# Patient Record
Sex: Male | Born: 1990
Health system: Southern US, Community
[De-identification: ages and names within clinical notes are randomized; demographics above are authoritative.]

## PROBLEM LIST (undated history)

## (undated) DIAGNOSIS — I10 Essential (primary) hypertension: Secondary | ICD-10-CM

## (undated) HISTORY — DX: Essential (primary) hypertension: I10

## (undated) HISTORY — PX: CARPAL TUNNEL RELEASE: SHX101

---
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
## 2012-07-30 ENCOUNTER — Observation Stay: Payer: Self-pay | Admitting: Surgery

## 2012-07-30 LAB — CBC WITH DIFFERENTIAL/PLATELET
Basophil #: 0.1 10*3/uL (ref 0.0–0.1)
Basophil #: 0 10*3/uL (ref 0.0–0.1)
Basophil %: 0.2 %
Eosinophil #: 0 10*3/uL (ref 0.0–0.7)
Eosinophil #: 0.1 10*3/uL (ref 0.0–0.7)
Eosinophil %: 0.1 %
Eosinophil %: 0.5 %
HCT: 46.1 % (ref 40.0–52.0)
HCT: 46.4 % (ref 40.0–52.0)
HGB: 15.4 g/dL (ref 13.0–18.0)
Lymphocyte #: 0.8 10*3/uL — ABNORMAL LOW (ref 1.0–3.6)
Lymphocyte #: 1.3 10*3/uL (ref 1.0–3.6)
Lymphocyte %: 3.9 %
MCH: 28.6 pg (ref 26.0–34.0)
MCH: 28.4 pg (ref 26.0–34.0)
MCHC: 33.5 g/dL (ref 32.0–36.0)
MCHC: 33.2 g/dL (ref 32.0–36.0)
MCV: 86 fL (ref 80–100)
Monocyte #: 1.1 x10 3/mm — ABNORMAL HIGH (ref 0.2–1.0)
Monocyte #: 1 x10 3/mm (ref 0.2–1.0)
Neutrophil #: 18 10*3/uL — ABNORMAL HIGH (ref 1.4–6.5)
Neutrophil #: 12.6 10*3/uL — ABNORMAL HIGH (ref 1.4–6.5)
Neutrophil %: 90 %
Neutrophil %: 83.5 %
RBC: 5.38 10*6/uL (ref 4.40–5.90)
RBC: 5.41 10*6/uL (ref 4.40–5.90)
RDW: 13.4 % (ref 11.5–14.5)
RDW: 13.4 % (ref 11.5–14.5)

## 2012-07-30 LAB — CBC
HCT: 45.8 % (ref 40.0–52.0)
MCH: 28.9 pg (ref 26.0–34.0)
MCHC: 33.7 g/dL (ref 32.0–36.0)
MCV: 86 fL (ref 80–100)
Platelet: 258 10*3/uL (ref 150–440)
RDW: 13.4 % (ref 11.5–14.5)

## 2012-07-30 LAB — URINALYSIS, COMPLETE
Granular Cast: 1
Hyaline Cast: 3
Ketone: NEGATIVE
Leukocyte Esterase: NEGATIVE
Nitrite: NEGATIVE
Protein: 30
Specific Gravity: 1.054 (ref 1.003–1.030)
WBC UR: 1 /HPF (ref 0–5)

## 2012-07-30 LAB — LIPASE, BLOOD: Lipase: 169 U/L (ref 73–393)

## 2012-07-31 LAB — CBC WITH DIFFERENTIAL/PLATELET
Basophil #: 0 10*3/uL (ref 0.0–0.1)
Eosinophil #: 0.1 10*3/uL (ref 0.0–0.7)
Lymphocyte %: 15.1 %
MCHC: 35 g/dL (ref 32.0–36.0)
Monocyte %: 9.7 %
Neutrophil %: 74 %
Platelet: 223 10*3/uL (ref 150–440)
RDW: 13.4 % (ref 11.5–14.5)

## 2013-04-01 ENCOUNTER — Emergency Department: Payer: Self-pay | Admitting: Emergency Medicine

## 2014-05-23 ENCOUNTER — Emergency Department: Payer: Self-pay | Admitting: Emergency Medicine
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
## 2016-05-07 ENCOUNTER — Encounter: Payer: Self-pay | Admitting: Family Medicine

## 2016-05-07 ENCOUNTER — Ambulatory Visit (INDEPENDENT_AMBULATORY_CARE_PROVIDER_SITE_OTHER): Payer: 59 | Admitting: Family Medicine

## 2016-05-07 VITALS — BP 148/84 | HR 92 | Temp 97.4°F | Ht 70.4 in | Wt 235.0 lb

## 2016-05-07 DIAGNOSIS — R109 Unspecified abdominal pain: Secondary | ICD-10-CM | POA: Diagnosis not present

## 2016-05-07 DIAGNOSIS — Z113 Encounter for screening for infections with a predominantly sexual mode of transmission: Secondary | ICD-10-CM | POA: Diagnosis not present

## 2016-05-07 DIAGNOSIS — R030 Elevated blood-pressure reading, without diagnosis of hypertension: Secondary | ICD-10-CM

## 2016-05-07 DIAGNOSIS — IMO0001 Reserved for inherently not codable concepts without codable children: Secondary | ICD-10-CM

## 2016-05-07 LAB — UA/M W/RFLX CULTURE, ROUTINE
Bilirubin, UA: NEGATIVE
Glucose, UA: NEGATIVE
Ketones, UA: NEGATIVE
LEUKOCYTES UA: NEGATIVE
NITRITE UA: NEGATIVE
PH UA: 5.5 (ref 5.0–7.5)
PROTEIN UA: NEGATIVE
RBC, UA: NEGATIVE
Specific Gravity, UA: 1.02 (ref 1.005–1.030)
Urobilinogen, Ur: 0.2 mg/dL (ref 0.2–1.0)

## 2016-05-07 LAB — MICROALBUMIN, URINE WAIVED
CREATININE, URINE WAIVED: 200 mg/dL (ref 10–300)
Microalb, Ur Waived: 30 mg/L — ABNORMAL HIGH (ref 0–19)
Microalb/Creat Ratio: <30 mg/g (ref <30)

## 2016-05-07 MED ORDER — NAPROXEN 500 MG PO TABS: 500.0000 mg | ORAL_TABLET | Freq: Two times a day (BID) | ORAL | 0 refills | Status: DC

## 2016-05-07 NOTE — Patient Instructions (Addendum)
DASH Eating Plan DASH stands for "Dietary Approaches to Stop Hypertension." The DASH eating plan is a healthy eating plan that has been shown to reduce high blood pressure (hypertension). Additional health benefits may include reducing the risk of type 2 diabetes mellitus, heart disease, and stroke. The DASH eating plan may also help with weight loss. WHAT DO I NEED TO KNOW ABOUT THE DASH EATING PLAN? For the DASH eating plan, you will follow these general guidelines:  Choose foods with a percent daily value for sodium of less than 5% (as listed on the food label).  Use salt-free seasonings or herbs instead of table salt or sea salt.  Check with your health care provider or pharmacist before using salt substitutes.  Eat lower-sodium products, often labeled as "lower sodium" or "no salt added."  Eat fresh foods.  Eat more vegetables, fruits, and low-fat dairy products.  Choose whole grains. Look for the word "whole" as the first word in the ingredient list.  Choose fish and skinless chicken or turkey more often than red meat. Limit fish, poultry, and meat to 6 oz (170 g) each day.  Limit sweets, desserts, sugars, and sugary drinks.  Choose heart-healthy fats.  Limit cheese to 1 oz (28 g) per day.  Eat more home-cooked food and less restaurant, buffet, and fast food.  Limit fried foods.  Cook foods using methods other than frying.  Limit canned vegetables. If you do use them, rinse them well to decrease the sodium.  When eating at a restaurant, ask that your food be prepared with less salt, or no salt if possible. WHAT FOODS CAN I EAT? Seek help from a dietitian for individual calorie needs. Grains Whole grain or whole wheat bread. Brown rice. Whole grain or whole wheat pasta. Quinoa, bulgur, and whole grain cereals. Low-sodium cereals. Corn or whole wheat flour tortillas. Whole grain cornbread. Whole grain crackers. Low-sodium crackers. Vegetables Fresh or frozen vegetables  (raw, steamed, roasted, or grilled). Low-sodium or reduced-sodium tomato and vegetable juices. Low-sodium or reduced-sodium tomato sauce and paste. Low-sodium or reduced-sodium canned vegetables.  Fruits All fresh, canned (in natural juice), or frozen fruits. Meat and Other Protein Products Ground beef (85% or leaner), grass-fed beef, or beef trimmed of fat. Skinless chicken or turkey. Ground chicken or turkey. Pork trimmed of fat. All fish and seafood. Eggs. Dried beans, peas, or lentils. Unsalted nuts and seeds. Unsalted canned beans. Dairy Low-fat dairy products, such as skim or 1% milk, 2% or reduced-fat cheeses, low-fat ricotta or cottage cheese, or plain low-fat yogurt. Low-sodium or reduced-sodium cheeses. Fats and Oils Tub margarines without trans fats. Light or reduced-fat mayonnaise and salad dressings (reduced sodium). Avocado. Safflower, olive, or canola oils. Natural peanut or almond butter. Other Unsalted popcorn and pretzels. The items listed above may not be a complete list of recommended foods or beverages. Contact your dietitian for more options. WHAT FOODS ARE NOT RECOMMENDED? Grains White bread. White pasta. White rice. Refined cornbread. Bagels and croissants. Crackers that contain trans fat. Vegetables Creamed or fried vegetables. Vegetables in a cheese sauce. Regular canned vegetables. Regular canned tomato sauce and paste. Regular tomato and vegetable juices. Fruits Dried fruits. Canned fruit in light or heavy syrup. Fruit juice. Meat and Other Protein Products Fatty cuts of meat. Ribs, chicken wings, bacon, sausage, bologna, salami, chitterlings, fatback, hot dogs, bratwurst, and packaged luncheon meats. Salted nuts and seeds. Canned beans with salt. Dairy Whole or 2% milk, cream, half-and-half, and cream cheese. Whole-fat or sweetened yogurt. Full-fat   cheeses or blue cheese. Nondairy creamers and whipped toppings. Processed cheese, cheese spreads, or cheese  curds. Condiments Onion and garlic salt, seasoned salt, table salt, and sea salt. Canned and packaged gravies. Worcestershire sauce. Tartar sauce. Barbecue sauce. Teriyaki sauce. Soy sauce, including reduced sodium. Steak sauce. Fish sauce. Oyster sauce. Cocktail sauce. Horseradish. Ketchup and mustard. Meat flavorings and tenderizers. Bouillon cubes. Hot sauce. Tabasco sauce. Marinades. Taco seasonings. Relishes. Fats and Oils Butter, stick margarine, lard, shortening, ghee, and bacon fat. Coconut, palm kernel, or palm oils. Regular salad dressings. Other Pickles and olives. Salted popcorn and pretzels. The items listed above may not be a complete list of foods and beverages to avoid. Contact your dietitian for more information. WHERE CAN I FIND MORE INFORMATION? National Heart, Lung, and Blood Institute: [employer]   This information is not intended to replace advice given to you by your health care provider. Make sure you discuss any questions you have with your health care provider.   Document Released: 09/11/2011 Document Revised: 10/13/2014 Document Reviewed: 07/27/2013 Elsevier Interactive Patient Education 2016 [employer]  Back Exercises The following exercises strengthen the muscles that help to support the back. They also help to keep the lower back flexible. Doing these exercises can help to prevent back pain or lessen existing pain. If you have back pain or discomfort, try doing these exercises 2-3 times each day or as told by your health care provider. When the pain goes away, do them once each day, but increase the number of times that you repeat the steps for each exercise (do more repetitions). If you do not have back pain or discomfort, do these exercises once each day or as told by your health care provider. EXERCISES Single Knee to Chest Repeat these steps 3-5 times for each leg: 1. Lie on your back on a firm bed or the floor with  your legs extended. 2. Bring one knee to your chest. Your other leg should stay extended and in contact with the floor. 3. Hold your knee in place by grabbing your knee or thigh. 4. Pull on your knee until you feel a gentle stretch in your lower back. 5. Hold the stretch for 10-30 seconds. 6. Slowly release and straighten your leg. Pelvic Tilt Repeat these steps 5-10 times: 1. Lie on your back on a firm bed or the floor with your legs extended. 2. Bend your knees so they are pointing toward the ceiling and your feet are flat on the floor. 3. Tighten your lower abdominal muscles to press your lower back against the floor. This motion will tilt your pelvis so your tailbone points up toward the ceiling instead of pointing to your feet or the floor. 4. With gentle tension and even breathing, hold this position for 5-10 seconds. Cat-Cow Repeat these steps until your lower back becomes more flexible: 1. Get into a hands-and-knees position on a firm surface. Keep your hands under your shoulders, and keep your knees under your hips. You may place padding under your knees for comfort. 2. Let your head hang down, and point your tailbone toward the floor so your lower back becomes rounded like the back of a cat. 3. Hold this position for 5 seconds. 4. Slowly lift your head and point your tailbone up toward the ceiling so your back forms a sagging arch like the back of a cow. 5. Hold this position for 5 seconds. Press-Ups Repeat these steps 5-10 times: 1. Lie on your abdomen (face-down) on the floor.  2. Place your palms near your head, about shoulder-width apart. 3. While you keep your back as relaxed as possible and keep your hips on the floor, slowly straighten your arms to raise the top half of your body and lift your shoulders. Do not use your back muscles to raise your upper torso. You may adjust the placement of your hands to make yourself more comfortable. 4. Hold this position for 5 seconds while  you keep your back relaxed. 5. Slowly return to lying flat on the floor. Bridges Repeat these steps 10 times: 1. Lie on your back on a firm surface. 2. Bend your knees so they are pointing toward the ceiling and your feet are flat on the floor. 3. Tighten your buttocks muscles and lift your buttocks off of the floor until your waist is at almost the same height as your knees. You should feel the muscles working in your buttocks and the back of your thighs. If you do not feel these muscles, slide your feet 1-2 inches farther away from your buttocks. 4. Hold this position for 3-5 seconds. 5. Slowly lower your hips to the starting position, and allow your buttocks muscles to relax completely. If this exercise is too easy, try doing it with your arms crossed over your chest. Abdominal Crunches Repeat these steps 5-10 times: 1. Lie on your back on a firm bed or the floor with your legs extended. 2. Bend your knees so they are pointing toward the ceiling and your feet are flat on the floor. 3. Cross your arms over your chest. 4. Tip your chin slightly toward your chest without bending your neck. 5. Tighten your abdominal muscles and slowly raise your trunk (torso) high enough to lift your shoulder blades a tiny bit off of the floor. Avoid raising your torso higher than that, because it can put too much stress on your low back and it does not help to strengthen your abdominal muscles. 6. Slowly return to your starting position. Back Lifts Repeat these steps 5-10 times: 1. Lie on your abdomen (face-down) with your arms at your sides, and rest your forehead on the floor. 2. Tighten the muscles in your legs and your buttocks. 3. Slowly lift your chest off of the floor while you keep your hips pressed to the floor. Keep the back of your head in line with the curve in your back. Your eyes should be looking at the floor. 4. Hold this position for 3-5 seconds. 5. Slowly return to your starting  position. SEEK MEDICAL CARE IF:  Your back pain or discomfort gets much worse when you do an exercise.  Your back pain or discomfort does not lessen within 2 hours after you exercise. If you have any of these problems, stop doing these exercises right away. Do not do them again unless your health care provider says that you can. SEEK IMMEDIATE MEDICAL CARE IF:  You develop sudden, severe back pain. If this happens, stop doing the exercises right away. Do not do them again unless your health care provider says that you can.   This information is not intended to replace advice given to you by your health care provider. Make sure you discuss any questions you have with your health care provider.   Document Released: 10/30/2004 Document Revised: 06/13/2015 Document Reviewed: 11/16/2014 Elsevier Interactive Patient Education [employer]

## 2016-05-07 NOTE — Progress Notes (Signed)
BP (!) 148/84   Pulse 92   Temp 97.4 F (36.3 C)   Ht 5' 10.4" (1.788 m)   Wt 235 lb (106.6 kg)   SpO2 99%   BMI 33.34 kg/m    Subjective:    Patient ID: Daniel Martinez, male    DOB: September 17, 1991, 25 y.o.   MRN: [id number]  HPI: Daniel Martinez is a 25 y.o. male who presents today to establish care  Chief Complaint  Patient presents with  . Establish Care   ELEVATED BLOOD PRESSURE Duration of elevated BP: unknown BP monitoring frequency: rarely BP range: unknown Previous BP meds: no Recent stressors: yes Family history of hypertension: yes Recurrent headaches: yes depends Visual changes: no Palpitations: no  Dyspnea: no Chest pain: no Lower extremity edema: no Dizzy/lightheaded: no Transient ischemic attacks: no  BACK PAIN Duration: 5 days Mechanism of injury: no trauma- had pain on the other side at work, but then that went away and the L side started Location: Left and low back Onset: sudden Severity: 3/10 Quality: pressure Frequency: intermittent Radiation: L testicle originally, gone now Aggravating factors: nothing Alleviating factors: tylenol Status: fluctuating Treatments attempted: rest and APAP  Relief with NSAIDs?: No NSAIDs Taken Nighttime pain:  no Paresthesias / decreased sensation:  no Bowel / bladder incontinence:  no Fevers:  no Dysuria / urinary frequency:  no   Active Ambulatory Problems    Diagnosis Date Noted  . No Active Ambulatory Problems   Resolved Ambulatory Problems    Diagnosis Date Noted  . No Resolved Ambulatory Problems   No Additional Past Medical History   No Known Allergies History reviewed. No pertinent surgical history. Social History   Social History  . Marital status: Single    Spouse name: N/A  . Number of children: N/A  . Years of education: N/A   Occupational History  . Not on file.   Social History Main Topics  . Smoking status: Never Smoker  . Smokeless tobacco: Never Used  . Alcohol use  No  . Drug use: No  . Sexual activity: Not Currently   Other Topics Concern  . Not on file   Social History Narrative  . No narrative on file   Family History  Problem Relation Age of Onset  . Diabetes Mother    Review of Systems  Constitutional: Negative.   Respiratory: Negative.   Cardiovascular: Negative.   Gastrointestinal: Negative.   Genitourinary: Positive for flank pain and testicular pain. Negative for decreased urine volume, difficulty urinating, discharge, dysuria, enuresis, frequency, genital sores, hematuria, penile pain, penile swelling, scrotal swelling and urgency.  Musculoskeletal: Positive for back pain and myalgias. Negative for arthralgias, gait problem, joint swelling, neck pain and neck stiffness.  Psychiatric/Behavioral: Negative.     Per HPI unless specifically indicated above     Objective:    BP (!) 148/84   Pulse 92   Temp 97.4 F (36.3 C)   Ht 5' 10.4" (1.788 m)   Wt 235 lb (106.6 kg)   SpO2 99%   BMI 33.34 kg/m   Wt Readings from Last 3 Encounters:  05/07/16 235 lb (106.6 kg)    Physical Exam  Constitutional: He is oriented to person, place, and time. He appears well-developed and well-nourished. No distress.  HENT:  Head: Normocephalic and atraumatic.  Right Ear: Hearing normal.  Left Ear: Hearing normal.  Nose: Nose normal.  Eyes: Conjunctivae and lids are normal. Right eye exhibits no discharge. Left eye exhibits no  discharge. No scleral icterus.  Cardiovascular: Normal rate, regular rhythm, normal heart sounds and intact distal pulses.  Exam reveals no gallop and no friction rub.   No murmur heard. Pulmonary/Chest: Effort normal and breath sounds normal. No respiratory distress. He has no wheezes. He has no rales. He exhibits no tenderness.  Abdominal: Soft. Bowel sounds are normal. He exhibits no distension and no mass. There is no tenderness. There is CVA tenderness (on the L). There is no rebound and no guarding.    Musculoskeletal: Normal range of motion.  Neurological: He is alert and oriented to person, place, and time.  Skin: Skin is warm, dry and intact. No rash noted. He is not diaphoretic. No erythema. No pallor.  Psychiatric: He has a normal mood and affect. His speech is normal and behavior is normal. Judgment and thought content normal. Cognition and memory are normal.  Nursing note and vitals reviewed. Back Exam:    Inspection:  Normal spinal curvature.  No deformity, ecchymosis, erythema, or lesions     Palpation:     Midline spinal tenderness: no      Paralumbar tenderness: yes- mild L paralumbar     Parathoracic tenderness: no      Buttocks tenderness: no     Range of Motion:      Flexion: Normal     Extension:Normal     Lateral bending:Normal    Rotation:Normal    Neuro Exam:Lower extremity DTRs normal & symmetric.  Strength and sensation intact.     Straight leg raise:negative     Assessment & Plan:   Problem List Items Addressed This Visit    None    Visit Diagnoses    Elevated BP    -  Primary   Better on recheck. Will work on [employer]. Microalbumin normal. Check back in 1 month.    Relevant Orders   Microalbumin, Urine Waived   Left flank pain       ?Kidney stone. Normal back exam. Normal Urine. Will treat with naproxen for 2 weeks. Call if not getting better or getting worse.    Relevant Orders   UA/M w/rflx Culture, Routine   Screening for STD (sexually transmitted disease)       Drawn today. Await results.    Relevant Orders   HIV antibody       Follow up plan: Return in about 4 weeks (around 06/04/2016) for Physical and BP check.

## 2016-05-08 ENCOUNTER — Encounter: Payer: Self-pay | Admitting: Family Medicine

## 2016-05-08 LAB — HIV ANTIBODY (ROUTINE TESTING W REFLEX): HIV Screen 4th Generation wRfx: NONREACTIVE

## 2016-05-15 ENCOUNTER — Ambulatory Visit: Payer: 59 | Admitting: Family Medicine

## 2016-05-16 ENCOUNTER — Encounter: Payer: Self-pay | Admitting: Family Medicine

## 2016-05-16 ENCOUNTER — Ambulatory Visit (INDEPENDENT_AMBULATORY_CARE_PROVIDER_SITE_OTHER): Payer: 59 | Admitting: Family Medicine

## 2016-05-16 VITALS — BP 138/85 | HR 71 | Temp 98.4°F | Wt 234.0 lb

## 2016-05-16 DIAGNOSIS — J029 Acute pharyngitis, unspecified: Secondary | ICD-10-CM | POA: Diagnosis not present

## 2016-05-16 MED ORDER — LIDOCAINE VISCOUS 2 % MT SOLN: 5.0000 mL | OROMUCOSAL | 0 refills | Status: DC | PRN

## 2016-05-16 NOTE — Progress Notes (Signed)
   BP 138/85   Pulse 71   Temp 98.4 F (36.9 C)   Wt 234 lb (106.1 kg)   SpO2 96%   BMI 33.20 kg/m    Subjective:    Patient ID: Daniel Martinez, male    DOB: 1991/03/10, 25 y.o.   MRN: [id number]  HPI: Daniel Martinez is a 25 y.o. male  Chief Complaint  Patient presents with  . Sore Throat    x 1 week.  Some congestion, some right ear discomfort.  No fever, no cough.   Patient presents with 1 week history of ST. Pain is 4/10 and scratchy in quality. Sometimes feels like it's swollen and hard to swallow. 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 Niece had strep about 3 weeks ago. Denies fever, chills, N/V. Has not tried taking anything OTC for the pain.   Relevant past medical, surgical, family and social history reviewed and updated as indicated. Interim medical history since our last visit reviewed. Allergies and medications reviewed and updated.  Review of Systems  Constitutional: Negative.   HENT: Positive for sore throat.   Respiratory: Negative.   Cardiovascular: Negative.   Gastrointestinal: Negative.   Musculoskeletal: Negative.   Neurological: Negative.   Psychiatric/Behavioral: Negative.     Per HPI unless specifically indicated above     Objective:    BP 138/85   Pulse 71   Temp 98.4 F (36.9 C)   Wt 234 lb (106.1 kg)   SpO2 96%   BMI 33.20 kg/m   Wt Readings from Last 3 Encounters:  05/16/16 234 lb (106.1 kg)  05/07/16 235 lb (106.6 kg)    Physical Exam  Constitutional: He is oriented to person, place, and time. He appears well-developed and well-nourished.  HENT:  Head: Atraumatic.  Right Ear: External ear normal.  Left Ear: External ear normal.  Nose: Nose normal.  Oropharynx erythematous  Eyes: Conjunctivae are normal. No scleral icterus.  Neck: Normal range of motion. Neck supple.  Cardiovascular: Normal rate and normal heart sounds.   Pulmonary/Chest: Effort normal and breath sounds normal. No respiratory  distress.  Musculoskeletal: Normal range of motion.  Neurological: He is alert and oriented to person, place, and time.  Skin: Skin is warm and dry.  Psychiatric: He has a normal mood and affect. His behavior is normal.  Nursing note and vitals reviewed.     Assessment & Plan:   Problem List Items Addressed This Visit    None    Visit Diagnoses    Sore throat    -  Primary   Likely irritant related, rapid strep negative - will await cx. Encouraged him to wear a mask at work. Viscous lidocaine sent, ibuprofen prn.    Relevant Orders   [facility])     Discussed that he should f/u for any changing or worsening symptoms.   Follow up plan: Return if symptoms worsen or fail to improve.

## 2016-05-16 NOTE — Patient Instructions (Signed)
Follow up as needed

## 2016-05-19 LAB — CULTURE, GROUP A STREP

## 2016-05-19 LAB — RAPID STREP SCREEN (MED CTR MEBANE ONLY): Strep Gp A Ag, IA W/Reflex: NEGATIVE

## 2016-05-20 ENCOUNTER — Telehealth: Payer: Self-pay | Admitting: Family Medicine

## 2016-05-20 MED ORDER — AMOXICILLIN 500 MG PO CAPS: 500.0000 mg | ORAL_CAPSULE | Freq: Two times a day (BID) | ORAL | 0 refills | Status: DC

## 2016-05-20 NOTE — Telephone Encounter (Signed)
Please call patient and let him know that his throat culture came back as a strain of strep throat so I have sent in an antibiotic to his pharmacy for him to start taking right away. Thanks

## 2016-05-20 NOTE — Telephone Encounter (Signed)
Left message to call.

## 2016-05-21 NOTE — Telephone Encounter (Signed)
Left message to call.

## 2016-05-22 NOTE — Telephone Encounter (Signed)
Left message to call.

## 2016-05-23 ENCOUNTER — Encounter: Payer: Self-pay | Admitting: Family Medicine

## 2016-05-28 ENCOUNTER — Encounter (INDEPENDENT_AMBULATORY_CARE_PROVIDER_SITE_OTHER): Payer: Self-pay

## 2016-06-13 ENCOUNTER — Ambulatory Visit (INDEPENDENT_AMBULATORY_CARE_PROVIDER_SITE_OTHER): Payer: 59 | Admitting: Family Medicine

## 2016-06-13 ENCOUNTER — Encounter: Payer: Self-pay | Admitting: Family Medicine

## 2016-06-13 VITALS — BP 144/81 | HR 87 | Temp 97.9°F | Ht 71.1 in | Wt 238.0 lb

## 2016-06-13 DIAGNOSIS — R030 Elevated blood-pressure reading, without diagnosis of hypertension: Secondary | ICD-10-CM | POA: Insufficient documentation

## 2016-06-13 DIAGNOSIS — IMO0001 Reserved for inherently not codable concepts without codable children: Secondary | ICD-10-CM

## 2016-06-13 DIAGNOSIS — Z23 Encounter for immunization: Secondary | ICD-10-CM

## 2016-06-13 DIAGNOSIS — Z0000 Encounter for general adult medical examination without abnormal findings: Secondary | ICD-10-CM

## 2016-06-13 LAB — MICROALBUMIN, URINE WAIVED
Creatinine, Urine Waived: 100 mg/dL (ref 10–300)
Microalb, Ur Waived: 30 mg/L — ABNORMAL HIGH (ref 0–19)
Microalb/Creat Ratio: <30 mg/g (ref <30)

## 2016-06-13 LAB — MICROSCOPIC EXAMINATION: EPITHELIAL CELLS (NON RENAL): NONE SEEN /HPF (ref 0–10)

## 2016-06-13 LAB — UA/M W/RFLX CULTURE, ROUTINE
BILIRUBIN UA: NEGATIVE
Glucose, UA: NEGATIVE
KETONES UA: NEGATIVE
Leukocytes, UA: NEGATIVE
Nitrite, UA: NEGATIVE
PH UA: 8.5 — AB (ref 5.0–7.5)
RBC UA: NEGATIVE
SPEC GRAV UA: 1.015 (ref 1.005–1.030)
UUROB: 1 mg/dL (ref 0.2–1.0)

## 2016-06-13 MED ORDER — AMOXICILLIN 500 MG PO CAPS: 500.0000 mg | ORAL_CAPSULE | Freq: Two times a day (BID) | ORAL | 0 refills | Status: DC

## 2016-06-13 NOTE — Patient Instructions (Addendum)
DASH Eating Plan DASH stands for "Dietary Approaches to Stop Hypertension." The DASH eating plan is a healthy eating plan that has been shown to reduce high blood pressure (hypertension). Additional health benefits may include reducing the risk of type 2 diabetes mellitus, heart disease, and stroke. The DASH eating plan may also help with weight loss. WHAT DO I NEED TO KNOW ABOUT THE DASH EATING PLAN? For the DASH eating plan, you will follow these general guidelines:  Choose foods with a percent daily value for sodium of less than 5% (as listed on the food label).  Use salt-free seasonings or herbs instead of table salt or sea salt.  Check with your health care provider or pharmacist before using salt substitutes.  Eat lower-sodium products, often labeled as "lower sodium" or "no salt added."  Eat fresh foods.  Eat more vegetables, fruits, and low-fat dairy products.  Choose whole grains. Look for the word "whole" as the first word in the ingredient list.  Choose fish and skinless chicken or turkey more often than red meat. Limit fish, poultry, and meat to 6 oz (170 g) each day.  Limit sweets, desserts, sugars, and sugary drinks.  Choose heart-healthy fats.  Limit cheese to 1 oz (28 g) per day.  Eat more home-cooked food and less restaurant, buffet, and fast food.  Limit fried foods.  Cook foods using methods other than frying.  Limit canned vegetables. If you do use them, rinse them well to decrease the sodium.  When eating at a restaurant, ask that your food be prepared with less salt, or no salt if possible. WHAT FOODS CAN I EAT? Seek help from a dietitian for individual calorie needs. Grains Whole grain or whole wheat bread. Brown rice. Whole grain or whole wheat pasta. Quinoa, bulgur, and whole grain cereals. Low-sodium cereals. Corn or whole wheat flour tortillas. Whole grain cornbread. Whole grain crackers. Low-sodium crackers. Vegetables Fresh or frozen vegetables  (raw, steamed, roasted, or grilled). Low-sodium or reduced-sodium tomato and vegetable juices. Low-sodium or reduced-sodium tomato sauce and paste. Low-sodium or reduced-sodium canned vegetables.  Fruits All fresh, canned (in natural juice), or frozen fruits. Meat and Other Protein Products Ground beef (85% or leaner), grass-fed beef, or beef trimmed of fat. Skinless chicken or turkey. Ground chicken or turkey. Pork trimmed of fat. All fish and seafood. Eggs. Dried beans, peas, or lentils. Unsalted nuts and seeds. Unsalted canned beans. Dairy Low-fat dairy products, such as skim or 1% milk, 2% or reduced-fat cheeses, low-fat ricotta or cottage cheese, or plain low-fat yogurt. Low-sodium or reduced-sodium cheeses. Fats and Oils Tub margarines without trans fats. Light or reduced-fat mayonnaise and salad dressings (reduced sodium). Avocado. Safflower, olive, or canola oils. Natural peanut or almond butter. Other Unsalted popcorn and pretzels. The items listed above may not be a complete list of recommended foods or beverages. Contact your dietitian for more options. WHAT FOODS ARE NOT RECOMMENDED? Grains White bread. White pasta. White rice. Refined cornbread. Bagels and croissants. Crackers that contain trans fat. Vegetables Creamed or fried vegetables. Vegetables in a cheese sauce. Regular canned vegetables. Regular canned tomato sauce and paste. Regular tomato and vegetable juices. Fruits Dried fruits. Canned fruit in light or heavy syrup. Fruit juice. Meat and Other Protein Products Fatty cuts of meat. Ribs, chicken wings, bacon, sausage, bologna, salami, chitterlings, fatback, hot dogs, bratwurst, and packaged luncheon meats. Salted nuts and seeds. Canned beans with salt. Dairy Whole or 2% milk, cream, half-and-half, and cream cheese. Whole-fat or sweetened yogurt. Full-fat   cheeses or blue cheese. Nondairy creamers and whipped toppings. Processed cheese, cheese spreads, or cheese  curds. Condiments Onion and garlic salt, seasoned salt, table salt, and sea salt. Canned and packaged gravies. Worcestershire sauce. Tartar sauce. Barbecue sauce. Teriyaki sauce. Soy sauce, including reduced sodium. Steak sauce. Fish sauce. Oyster sauce. Cocktail sauce. Horseradish. Ketchup and mustard. Meat flavorings and tenderizers. Bouillon cubes. Hot sauce. Tabasco sauce. Marinades. Taco seasonings. Relishes. Fats and Oils Butter, stick margarine, lard, shortening, ghee, and bacon fat. Coconut, palm kernel, or palm oils. Regular salad dressings. Other Pickles and olives. Salted popcorn and pretzels. The items listed above may not be a complete list of foods and beverages to avoid. Contact your dietitian for more information. WHERE CAN I FIND MORE INFORMATION? National Heart, Lung, and Blood Institute: CablePromo.it   This information is not intended to replace advice given to you by your health care provider. Make sure you discuss any questions you have with your health care provider.   Document Released: 09/11/2011 Document Revised: 10/13/2014 Document Reviewed: 07/27/2013 Elsevier Interactive Patient Education 2016 [employer]. Health Maintenance, Male A healthy lifestyle and preventative care can promote health and wellness.  Maintain regular health, dental, and eye exams.  Eat a healthy diet. Foods like vegetables, fruits, whole grains, low-fat dairy products, and lean protein foods contain the nutrients you need and are low in calories. Decrease your intake of foods high in solid fats, added sugars, and salt. Get information about a proper diet from your health care provider, if necessary.  Regular physical exercise is one of the most important things you can do for your health. Most adults should get at least 150 minutes of moderate-intensity exercise (any activity that increases your heart rate and causes you to sweat) each week. In addition,  most adults need muscle-strengthening exercises on 2 or more days a week.   Maintain a healthy weight. The body mass index (BMI) is a screening tool to identify possible weight problems. It provides an estimate of body fat based on height and weight. Your health care provider can find your BMI and can help you achieve or maintain a healthy weight. For males 20 years and older:  A BMI below 18.5 is considered underweight.  A BMI of 18.5 to 24.9 is normal.  A BMI of 25 to 29.9 is considered overweight.  A BMI of 30 and above is considered obese.  Maintain normal blood lipids and cholesterol by exercising and minimizing your intake of saturated fat. Eat a balanced diet with plenty of fruits and vegetables. Blood tests for lipids and cholesterol should begin at age 67 and be repeated every 5 years. If your lipid or cholesterol levels are high, you are over age 73, or you are at high risk for heart disease, you may need your cholesterol levels checked more frequently.Ongoing high lipid and cholesterol levels should be treated with medicines if diet and exercise are not working.  If you smoke, find out from your health care provider how to quit. If you do not use tobacco, do not start.  Lung cancer screening is recommended for adults aged 55-80 years who are at high risk for developing lung cancer because of a history of smoking. A yearly low-dose CT scan of the lungs is recommended for people who have at least a 30-pack-year history of smoking and are current smokers or have quit within the past 15 years. A pack year of smoking is smoking an average of 1 pack of cigarettes a day  for 1 year (for example, a 30-pack-year history of smoking could mean smoking 1 pack a day for 30 years or 2 packs a day for 15 years). Yearly screening should continue until the smoker has stopped smoking for at least 15 years. Yearly screening should be stopped for people who develop a health problem that would prevent them  from having lung cancer treatment.  If you choose to drink alcohol, do not have more than 2 drinks per day. One drink is considered to be 12 oz (360 mL) of beer, 5 oz (150 mL) of wine, or 1.5 oz (45 mL) of liquor.  Avoid the use of street drugs. Do not share needles with anyone. Ask for help if you need support or instructions about stopping the use of drugs.  High blood pressure causes heart disease and increases the risk of stroke. High blood pressure is more likely to develop in:  People who have blood pressure in the end of the normal range (100-139/85-89 mm Hg).  People who are overweight or obese.  People who are African American.  If you are 62-7 years of age, have your blood pressure checked every 3-5 years. If you are 41 years of age or older, have your blood pressure checked every year. You should have your blood pressure measured twice--once when you are at a hospital or clinic, and once when you are not at a hospital or clinic. Record the average of the two measurements. To check your blood pressure when you are not at a hospital or clinic, you can use:  An automated blood pressure machine at a pharmacy.  A home blood pressure monitor.  If you are 29-70 years old, ask your health care provider if you should take aspirin to prevent heart disease.  Diabetes screening involves taking a blood sample to check your fasting blood sugar level. This should be done once every 3 years after age 62 if you are at a normal weight and without risk factors for diabetes. Testing should be considered at a younger age or be carried out more frequently if you are overweight and have at least 1 risk factor for diabetes.  Colorectal cancer can be detected and often prevented. Most routine colorectal cancer screening begins at the age of 59 and continues through age 49. However, your health care provider may recommend screening at an earlier age if you have risk factors for colon cancer. On a yearly  basis, your health care provider may provide home test kits to check for hidden blood in the stool. A small camera at the end of a tube may be used to directly examine the colon (sigmoidoscopy or colonoscopy) to detect the earliest forms of colorectal cancer. Talk to your health care provider about this at age 93 when routine screening begins. A direct exam of the colon should be repeated every 5-10 years through age 82, unless early forms of precancerous polyps or small growths are found.  People who are at an increased risk for hepatitis B should be screened for this virus. You are considered at high risk for hepatitis B if:  You were born in a country where hepatitis B occurs often. Talk with your health care provider about which countries are considered high risk.  Your parents were born in a high-risk country and you have not received a shot to protect against hepatitis B (hepatitis B vaccine).  You have HIV or AIDS.  You use needles to inject street drugs.  You live with,  or have sex with, someone who has hepatitis B.  You are a man who has sex with other men (MSM).  You get hemodialysis treatment.  You take certain medicines for conditions like cancer, organ transplantation, and autoimmune conditions.  Hepatitis C blood testing is recommended for all people born from [birth year] through 1965 and any individual with known risk factors for hepatitis C.  Healthy men should no longer receive prostate-specific antigen (PSA) blood tests as part of routine cancer screening. Talk to your health care provider about prostate cancer screening.  Testicular cancer screening is not recommended for adolescents or adult males who have no symptoms. Screening includes self-exam, a health care provider exam, and other screening tests. Consult with your health care provider about any symptoms you have or any concerns you have about testicular cancer.  Practice safe sex. Use condoms and avoid high-risk sexual  practices to reduce the spread of sexually transmitted infections (STIs).  You should be screened for STIs, including gonorrhea and chlamydia if:  You are sexually active and are younger than 24 years.  You are older than 24 years, and your health care provider tells you that you are at risk for this type of infection.  Your sexual activity has changed since you were last screened, and you are at an increased risk for chlamydia or gonorrhea. Ask your health care provider if you are at risk.  If you are at risk of being infected with HIV, it is recommended that you take a prescription medicine daily to prevent HIV infection. This is called pre-exposure prophylaxis (PrEP). You are considered at risk if:  You are a man who has sex with other men (MSM).  You are a heterosexual man who is sexually active with multiple partners.  You take drugs by injection.  You are sexually active with a partner who has HIV.  Talk with your health care provider about whether you are at high risk of being infected with HIV. If you choose to begin PrEP, you should first be tested for HIV. You should then be tested every 3 months for as long as you are taking PrEP.  Use sunscreen. Apply sunscreen liberally and repeatedly throughout the day. You should seek shade when your shadow is shorter than you. Protect yourself by wearing long sleeves, pants, a wide-brimmed hat, and sunglasses year round whenever you are outdoors.  Tell your health care provider of new moles or changes in moles, especially if there is a change in shape or color. Also, tell your health care provider if a mole is larger than the size of a pencil eraser.  A one-time screening for abdominal aortic aneurysm (AAA) and surgical repair of large AAAs by ultrasound is recommended for men aged 65-75 years who are current or former smokers.  Stay current with your vaccines (immunizations).   This information is not intended to replace advice given to  you by your health care provider. Make sure you discuss any questions you have with your health care provider.   Document Released: 03/20/2008 Document Revised: 10/13/2014 Document Reviewed: 02/17/2011 Elsevier Interactive Patient Education 2016 Elsevier Inc. Influenza (Flu) Vaccine (Inactivated or Recombinant):  1. Why get vaccinated? Influenza ("flu") is a contagious disease that spreads around the [place] every year, usually between October and May. Flu is caused by influenza viruses, and is spread mainly by coughing, sneezing, and close contact. Anyone can get flu. Flu strikes suddenly and can last several days. Symptoms vary by age, but can include:  fever/chills  sore throat  muscle aches  fatigue  cough  headache  runny or stuffy nose Flu can also lead to pneumonia and blood infections, and cause diarrhea and seizures in children. If you have a medical condition, such as heart or lung disease, flu can make it worse. Flu is more dangerous for some people. Infants and young children, people 25 years of age and older, pregnant women, and people with certain health conditions or a weakened immune system are at greatest risk. Each year thousands of people in the [place] die from flu, and many more are hospitalized. Flu vaccine can:  keep you from getting flu,  make flu less severe if you do get it, and  keep you from spreading flu to your family and other people. 2. Inactivated and recombinant flu vaccines A dose of flu vaccine is recommended every flu season. Children 6 months through 258 years of age may need two doses during the same flu season. Everyone else needs only one dose each flu season. Some inactivated flu vaccines contain a very small amount of a mercury-based preservative called thimerosal. Studies have not shown thimerosal in vaccines to be harmful, but flu vaccines that do not contain thimerosal are available. There is no live flu virus in flu shots.  They cannot cause the flu. There are many flu viruses, and they are always changing. Each year a new flu vaccine is made to protect against three or four viruses that are likely to cause disease in the upcoming flu season. But even when the vaccine doesn't exactly match these viruses, it may still provide some protection. Flu vaccine cannot prevent:  flu that is caused by a virus not covered by the vaccine, or  illnesses that look like flu but are not. It takes about 2 weeks for protection to develop after vaccination, and protection lasts through the flu season. 3. Some people should not get this vaccine Tell the person who is giving you the vaccine:  If you have any severe, life-threatening allergies. If you ever had a life-threatening allergic reaction after a dose of flu vaccine, or have a severe allergy to any part of this vaccine, you may be advised not to get vaccinated. Most, but not all, types of flu vaccine contain a small amount of egg protein.  If you ever had Guillain-Barre Syndrome (also called GBS). Some people with a history of GBS should not get this vaccine. This should be discussed with your doctor.  If you are not feeling well. It is usually okay to get flu vaccine when you have a mild illness, but you might be asked to come back when you feel better. 4. Risks of a vaccine reaction With any medicine, including vaccines, there is a chance of reactions. These are usually mild and go away on their own, but serious reactions are also possible. Most people who get a flu shot do not have any problems with it. Minor problems following a flu shot include:  soreness, redness, or swelling where the shot was given  hoarseness  sore, red or itchy eyes  cough  fever  aches  headache  itching  fatigue If these problems occur, they usually begin soon after the shot and last 1 or 2 days. More serious problems following a flu shot can include the following:  There may be a  small increased risk of Guillain-Barre Syndrome (GBS) after inactivated flu vaccine. This risk has been estimated at 1 or 2 additional cases per  million people vaccinated. This is much lower than the risk of severe complications from flu, which can be prevented by flu vaccine.  Young children who get the flu shot along with pneumococcal vaccine (PCV13) and/or DTaP vaccine at the same time might be slightly more likely to have a seizure caused by fever. Ask your doctor for more information. Tell your doctor if a child who is getting flu vaccine has ever had a seizure. Problems that could happen after any injected vaccine:  People sometimes faint after a medical procedure, including vaccination. Sitting or lying down for about 15 minutes can help prevent fainting, and injuries caused by a fall. Tell your doctor if you feel dizzy, or have vision changes or ringing in the ears.  Some people get severe pain in the shoulder and have difficulty moving the arm where a shot was given. This happens very rarely.  Any medication can cause a severe allergic reaction. Such reactions from a vaccine are very rare, estimated at about 1 in a million doses, and would happen within a few minutes to a few hours after the vaccination. As with any medicine, there is a very remote chance of a vaccine causing a serious injury or death. The safety of vaccines is always being monitored. For more information, visit: http://floyd.org/ 5. What if there is a serious reaction? What should I look for?  Look for anything that concerns you, such as signs of a severe allergic reaction, very high fever, or unusual behavior. Signs of a severe allergic reaction can include hives, swelling of the face and throat, difficulty breathing, a fast heartbeat, dizziness, and weakness. These would start a few minutes to a few hours after the vaccination. What should I do?  If you think it is a severe allergic reaction or other emergency  that can't wait, call 9-1-1 and get the person to the nearest hospital. Otherwise, call your doctor.  Reactions should be reported to the Vaccine Adverse Event Reporting System (VAERS). Your doctor should file this report, or you can do it yourself through the VAERS web site at www.vaers.LAgents.no, or by calling 1-641-404-7856. VAERS does not give medical advice. 6. The National Vaccine Injury Compensation Program The [employer] Vaccine Injury Compensation Program (VICP) is a federal program that was created to compensate people who may have been injured by certain vaccines. Persons who believe they may have been injured by a vaccine can learn about the program and about filing a claim by calling 1-(212) 408-8603 or visiting the VICP website at SpiritualWord.at. There is a time limit to file a claim for compensation. 7. How can I learn more?  Ask your healthcare provider. He or she can give you the vaccine package insert or suggest other sources of information.  Call your local or state health department.  Contact the Centers for Disease Control and Prevention (CDC):  Call 709 175 8614 (1-800-CDC-INFO) or  Visit CDC's website at BiotechRoom.com.cy Vaccine Information Statement Inactivated Influenza Vaccine (05/12/2014)   This information is not intended to replace advice given to you by your health care provider. Make sure you discuss any questions you have with your health care provider.   Document Released: 07/17/2006 Document Revised: 10/13/2014 Document Reviewed: 05/15/2014 Elsevier Interactive Patient Education [employer]

## 2016-06-13 NOTE — Assessment & Plan Note (Signed)
Up to date on vaccines. Screening labs checked today. Continue diet and exercise. Recheck 3 months.

## 2016-06-13 NOTE — Assessment & Plan Note (Addendum)
Will work on the [employer]ASH diet. Negative microalbumin. 3 months to get BP under control with diet. If not better, will start medication. Follow up 3 months.

## 2016-06-13 NOTE — Progress Notes (Signed)
BP (!) 144/81   Pulse 87   Temp 97.9 F (36.6 C)   Ht 5' 11.1" (1.806 m)   Wt 238 lb (108 kg)   SpO2 98%   BMI 33.10 kg/m    Subjective:    Patient ID: Daniel Martinez, male    DOB: June 28, 1991, 25 y.o.   MRN: [id number]  HPI: Daniel Martinez is a 25 y.o. male presenting on 06/13/2016 for comprehensive medical examination. Current medical complaints include:  ELEVATED BLOOD PRESSURE- did eat 2 sausage biscuits before he came in.  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 No pertinent past medical history.  Surgical History:  History reviewed. No pertinent surgical history.  Medications:  No current outpatient prescriptions on file prior to visit.   No current facility-administered medications on file prior to visit.     Allergies:  No Known Allergies  Social History:  Social History   Social History  . Marital status: Single    Spouse name: N/A  . Number of children: N/A  . Years of education: N/A   Occupational History  . Not on file.   Social History Main Topics  . Smoking status: Never Smoker  . Smokeless tobacco: Never Used  .  Alcohol use No  . Drug use: No  . Sexual activity: Not Currently   Other Topics Concern  . Not on file   Social History Narrative  . No narrative on file   History  Smoking Status  . Never Smoker  Smokeless Tobacco  . Never Used   History  Alcohol Use No    Family History:  Family History  Problem Relation Age of Onset  . Diabetes Mother     Past medical history, surgical history, medications, allergies, family history and social history reviewed with patient today and changes made to appropriate areas of the chart.   Review of Systems  Constitutional: Negative.   HENT: Positive for sore throat. Negative for congestion, ear discharge, ear pain, hearing loss, nosebleeds and tinnitus.   Eyes: Negative.   Respiratory: Negative.  Negative for stridor.   Cardiovascular: Negative.   Gastrointestinal: Negative.   Genitourinary: Negative.   Musculoskeletal: Negative.   Skin: Negative.   Neurological: Positive for tingling (in his hair, feels like goosebumps, happens rarely) and headaches. Negative for dizziness, tremors, sensory change, speech change, focal weakness, seizures and loss of consciousness.  Endo/Heme/Allergies: Positive for polydipsia. Negative for environmental allergies. Does not bruise/bleed easily.    All other ROS negative except what is listed above and in the HPI.      Objective:    BP (!) 144/81   Pulse 87   Temp 97.9  F (36.6 C)   Ht 5' 11.1" (1.806 m)   Wt 238 lb (108 kg)   SpO2 98%   BMI 33.10 kg/m   Wt Readings from Last 3 Encounters:  06/13/16 238 lb (108 kg)  05/16/16 234 lb (106.1 kg)  05/07/16 235 lb (106.6 kg)    Physical Exam  Constitutional: He is oriented to person, place, and time. He appears well-developed and well-nourished. No distress.  HENT:  Head: Normocephalic and atraumatic.  Right Ear: Hearing, tympanic membrane, external ear and ear canal normal.  Left Ear: Hearing, tympanic membrane, external ear and ear canal  normal.  Nose: Nose normal.  Mouth/Throat: Uvula is midline, oropharynx is clear and moist and mucous membranes are normal. No oropharyngeal exudate.  Eyes: Conjunctivae, EOM and lids are normal. Pupils are equal, round, and reactive to light. Right eye exhibits no discharge. Left eye exhibits no discharge. No scleral icterus.  Neck: Normal range of motion. Neck supple. No JVD present. No tracheal deviation present. No thyromegaly present.  Cardiovascular: Normal rate, regular rhythm, normal heart sounds and intact distal pulses.  Exam reveals no gallop and no friction rub.   No murmur heard. Pulmonary/Chest: Effort normal and breath sounds normal. No stridor. No respiratory distress. He has no wheezes. He has no rales. He exhibits no tenderness.  Abdominal: Soft. Bowel sounds are normal. He exhibits no distension and no mass. There is no tenderness. There is no rebound and no guarding.  Genitourinary: Penis normal. No penile tenderness.  Musculoskeletal: Normal range of motion. He exhibits no edema, tenderness or deformity.  Lymphadenopathy:    He has cervical adenopathy.  Neurological: He is alert and oriented to person, place, and time. He has normal reflexes. He displays normal reflexes. No cranial nerve deficit. He exhibits normal muscle tone. Coordination normal.  Skin: Skin is warm, dry and intact. No rash noted. He is not diaphoretic. No erythema. No pallor.  Psychiatric: He has a normal mood and affect. His speech is normal and behavior is normal. Judgment and thought content normal. Cognition and memory are normal.    Results for orders placed or performed in visit on 05/16/16  Rapid strep screen (not at [facility])  Result Value Ref Range   Strep Gp A Ag, IA W/Reflex Negative Negative  Culture, Group A Strep  Result Value Ref Range   Strep A Culture Comment (A)       Assessment & Plan:   Problem List Items Addressed This Visit      Other   Elevated blood pressure    Will work on  the [employer]. Negative microalbumin. 3 months to get BP under control with diet. If not better, will start medication. Follow up 3 months.       Relevant Orders   Microalbumin, Urine Waived   RESOLVED: Routine general medical examination at a health care facility - Primary    Up to date on vaccines. Screening labs checked today. Continue diet and exercise. Recheck 3 months.       Relevant Orders   CBC with Differential/Platelet   Comprehensive metabolic panel   Lipid Panel w/o Chol/HDL Ratio   TSH   UA/M w/rflx Culture, Routine    Other Visit Diagnoses    Immunization due       Flu shot given today.   Relevant Orders   Flu Vaccine QUAD 36+ mos PF IM (Fluarix & Fluzone Quad PF) (Completed)      Discussed aspirin prophylaxis for myocardial infarction prevention  and decision was it was not indicated  LABORATORY TESTING:  Health maintenance labs ordered today as discussed above.   IMMUNIZATIONS:   - Tdap: Tetanus vaccination status reviewed: last tetanus booster within 10 years. - Influenza: Administered today - Pneumovax: Not applicable  PATIENT COUNSELING:    Sexuality: Discussed sexually transmitted diseases, partner selection, use of condoms, avoidance of unintended pregnancy  and contraceptive alternatives.   Advised to avoid cigarette smoking.  I discussed with the patient that most people either abstain from alcohol or drink within safe limits (<=14/week and <=4 drinks/occasion for males, <=7/weeks and <= 3 drinks/occasion for females) and that the risk for alcohol disorders and other health effects rises proportionally with the number of drinks per week and how often a drinker exceeds daily limits.  Discussed cessation/primary prevention of drug use and availability of treatment for abuse.   Diet: Encouraged to adjust caloric intake to maintain  or achieve ideal body weight, to reduce intake of dietary saturated fat and total fat, to limit sodium intake by avoiding  high sodium foods and not adding table salt, and to maintain adequate dietary potassium and calcium preferably from fresh fruits, vegetables, and low-fat dairy products.    stressed the importance of regular exercise  Injury prevention: Discussed safety belts, safety helmets, smoke detector, smoking near bedding or upholstery.   Dental health: Discussed importance of regular tooth brushing, flossing, and dental visits.   Follow up plan: NEXT PREVENTATIVE PHYSICAL DUE IN 1 YEAR. Return in about 3 months (around 09/12/2016) for Follow up BP.

## 2016-06-14 LAB — COMPREHENSIVE METABOLIC PANEL
A/G RATIO: 1.6 (ref 1.2–2.2)
ALBUMIN: 4.4 g/dL (ref 3.5–5.5)
ALK PHOS: 87 IU/L (ref 39–117)
ALT: 50 IU/L — ABNORMAL HIGH (ref 0–44)
AST: 24 IU/L (ref 0–40)
BILIRUBIN TOTAL: 0.7 mg/dL (ref 0.0–1.2)
BUN / CREAT RATIO: 10 (ref 9–20)
BUN: 9 mg/dL (ref 6–20)
CHLORIDE: 100 mmol/L (ref 96–106)
CO2: 27 mmol/L (ref 18–29)
Calcium: 9.5 mg/dL (ref 8.7–10.2)
Creatinine, Ser: 0.89 mg/dL (ref 0.76–1.27)
GFR calc non Af Amer: 119 mL/min/{1.73_m2} (ref >59)
GFR, EST AFRICAN AMERICAN: 137 mL/min/{1.73_m2} (ref >59)
GLUCOSE: 116 mg/dL — AB (ref 65–99)
Globulin, Total: 2.8 g/dL (ref 1.5–4.5)
POTASSIUM: 4.3 mmol/L (ref 3.5–5.2)
Sodium: 143 mmol/L (ref 134–144)
TOTAL PROTEIN: 7.2 g/dL (ref 6.0–8.5)

## 2016-06-14 LAB — CBC WITH DIFFERENTIAL/PLATELET
BASOS ABS: 0 10*3/uL (ref 0.0–0.2)
BASOS: 1 %
EOS (ABSOLUTE): 0.4 10*3/uL (ref 0.0–0.4)
Eos: 5 %
HEMOGLOBIN: 15.6 g/dL (ref 12.6–17.7)
Hematocrit: 45 % (ref 37.5–51.0)
IMMATURE GRANS (ABS): 0 10*3/uL (ref 0.0–0.1)
Immature Granulocytes: 0 %
LYMPHS ABS: 2 10*3/uL (ref 0.7–3.1)
LYMPHS: 25 %
MCH: 28.6 pg (ref 26.6–33.0)
MCHC: 34.7 g/dL (ref 31.5–35.7)
MCV: 83 fL (ref 79–97)
Monocytes Absolute: 0.6 10*3/uL (ref 0.1–0.9)
Monocytes: 7 %
NEUTROS ABS: 5 10*3/uL (ref 1.4–7.0)
Neutrophils: 62 %
PLATELETS: 258 10*3/uL (ref 150–379)
RBC: 5.45 x10E6/uL (ref 4.14–5.80)
RDW: 13.4 % (ref 12.3–15.4)
WBC: 8.1 10*3/uL (ref 3.4–10.8)

## 2016-06-14 LAB — LIPID PANEL W/O CHOL/HDL RATIO
CHOLESTEROL TOTAL: 168 mg/dL (ref 100–199)
HDL: 44 mg/dL (ref >39)
LDL Calculated: 60 mg/dL (ref 0–99)
Triglycerides: 318 mg/dL — ABNORMAL HIGH (ref 0–149)
VLDL Cholesterol Cal: 64 mg/dL — ABNORMAL HIGH (ref 5–40)

## 2016-06-14 LAB — TSH: TSH: 0.929 u[IU]/mL (ref 0.450–4.500)

## 2016-06-16 ENCOUNTER — Encounter: Payer: Self-pay | Admitting: Family Medicine

## 2016-06-19 ENCOUNTER — Telehealth: Payer: Self-pay | Admitting: Family Medicine

## 2016-06-19 NOTE — Telephone Encounter (Signed)
Called patient and reassured him that his labs look normal.

## 2016-06-19 NOTE — Telephone Encounter (Signed)
Pt called would like a call back to discuss test results. Please call pt back ASAP. Thanks

## 2016-06-19 NOTE — Telephone Encounter (Signed)
Forward to provider

## 2016-07-14 LAB — HM DIABETES EYE EXAM

## 2016-09-03 ENCOUNTER — Encounter: Payer: Self-pay | Admitting: Family Medicine

## 2016-09-03 ENCOUNTER — Ambulatory Visit (INDEPENDENT_AMBULATORY_CARE_PROVIDER_SITE_OTHER): Payer: 59 | Admitting: Family Medicine

## 2016-09-03 VITALS — BP 149/87 | HR 79 | Temp 98.5°F | Wt 239.0 lb

## 2016-09-03 DIAGNOSIS — R1030 Lower abdominal pain, unspecified: Secondary | ICD-10-CM | POA: Diagnosis not present

## 2016-09-03 NOTE — Patient Instructions (Signed)
Miralax, water, fiber gummies

## 2016-09-03 NOTE — Progress Notes (Signed)
   BP (!) 149/87   Pulse 79   Temp 98.5 F (36.9 C)   Wt 239 lb (108.4 kg)   SpO2 100%   BMI 33.24 kg/m    Subjective:    Patient ID: Daniel Martinez, male    DOB: 1991-03-26, 25 y.o.   MRN: [id number]  HPI: Daniel Martinez is a 25 y.o. male  Chief Complaint  Patient presents with  . Abdominal Pain    pain on lower right side and constipation x a few days. Wants to make sure the constipation isn't causing problems. No fever. No Nausea or Vomiting.   Patient presents with several day history of abdominal bloating and RLQ pressure/discomfort. States he has been constipated and feels like he is not completely voiding during BMs lately. Denies N/V, fever, melena. Tried some laxatives with minimal relief. States he does not eat much fruits or veggies, and lately has started drinking sodas again instead of water which seems to have made a big impact.   Relevant past medical, surgical, family and social history reviewed and updated as indicated. Interim medical history since our last visit reviewed. Allergies and medications reviewed and updated.  Review of Systems  Constitutional: Negative.   HENT: Negative.   Eyes: Negative.   Respiratory: Negative.   Cardiovascular: Negative.   Gastrointestinal: Positive for abdominal pain and constipation.  Genitourinary: Negative.   Musculoskeletal: Negative.   Skin: Negative.   Neurological: Negative.   Psychiatric/Behavioral: Negative.     Per HPI unless specifically indicated above     Objective:    BP (!) 149/87   Pulse 79   Temp 98.5 F (36.9 C)   Wt 239 lb (108.4 kg)   SpO2 100%   BMI 33.24 kg/m   Wt Readings from Last 3 Encounters:  09/03/16 239 lb (108.4 kg)  06/13/16 238 lb (108 kg)  05/16/16 234 lb (106.1 kg)    Physical Exam  Constitutional: He is oriented to person, place, and time. He appears well-developed and well-nourished. No distress.  HENT:  Head: Atraumatic.  Eyes: Conjunctivae are normal. Pupils are  equal, round, and reactive to light. No scleral icterus.  Neck: Normal range of motion. Neck supple.  Cardiovascular: Normal rate, regular rhythm and normal heart sounds.   Pulmonary/Chest: Effort normal and breath sounds normal. No respiratory distress.  Abdominal: Soft. Bowel sounds are normal. There is tenderness (mildly TTP in lower abdoemen b/l). There is no guarding.  Musculoskeletal: Normal range of motion.  Neurological: He is alert and oriented to person, place, and time.  Skin: Skin is warm and dry.  Psychiatric: He has a normal mood and affect. His behavior is normal.  Nursing note and vitals reviewed.     Assessment & Plan:   Problem List Items Addressed This Visit    None    Visit Diagnoses    Lower abdominal pain    -  Primary   Highly likely related to constipation - start daily miralax, cut out soft drinks and increase water, increase dietary fiber or supplemental fiber     Has physical exam scheduled for next week, can follow up on how things are going at that time  Follow up plan: Return for as scheduled.

## 2016-09-11 ENCOUNTER — Other Ambulatory Visit: Payer: Self-pay | Admitting: Family Medicine

## 2016-09-11 DIAGNOSIS — R030 Elevated blood-pressure reading, without diagnosis of hypertension: Secondary | ICD-10-CM

## 2016-09-12 ENCOUNTER — Ambulatory Visit (INDEPENDENT_AMBULATORY_CARE_PROVIDER_SITE_OTHER): Payer: 59 | Admitting: Family Medicine

## 2016-09-12 ENCOUNTER — Encounter: Payer: Self-pay | Admitting: Family Medicine

## 2016-09-12 VITALS — BP 143/79 | HR 67 | Temp 98.0°F | Wt 241.3 lb

## 2016-09-12 DIAGNOSIS — I10 Essential (primary) hypertension: Secondary | ICD-10-CM | POA: Insufficient documentation

## 2016-09-12 LAB — MICROALBUMIN, URINE WAIVED
CREATININE, URINE WAIVED: 300 mg/dL (ref 10–300)
MICROALB, UR WAIVED: 30 mg/L — AB (ref 0–19)

## 2016-09-12 MED ORDER — LISINOPRIL 5 MG PO TABS: 5.0000 mg | ORAL_TABLET | Freq: Every day | ORAL | 3 refills | Status: DC

## 2016-09-12 NOTE — Assessment & Plan Note (Signed)
No better. Will start him on 5mg  lisinopril. Recheck 1 month.

## 2016-09-12 NOTE — Patient Instructions (Addendum)
DASH Eating Plan DASH stands for "Dietary Approaches to Stop Hypertension." The DASH eating plan is a healthy eating plan that has been shown to reduce high blood pressure (hypertension). Additional health benefits may include reducing the risk of type 2 diabetes mellitus, heart disease, and stroke. The DASH eating plan may also help with weight loss. What do I need to know about the DASH eating plan? For the DASH eating plan, you will follow these general guidelines:  Choose foods with less than 150 milligrams of sodium per serving (as listed on the food label).  Use salt-free seasonings or herbs instead of table salt or sea salt.  Check with your health care provider or pharmacist before using salt substitutes.  Eat lower-sodium products. These are often labeled as "low-sodium" or "no salt added."  Eat fresh foods. Avoid eating a lot of canned foods.  Eat more vegetables, fruits, and low-fat dairy products.  Choose whole grains. Look for the word "whole" as the first word in the ingredient list.  Choose fish and skinless chicken or turkey more often than red meat. Limit fish, poultry, and meat to 6 oz (170 g) each day.  Limit sweets, desserts, sugars, and sugary drinks.  Choose heart-healthy fats.  Eat more home-cooked food and less restaurant, buffet, and fast food.  Limit fried foods.  Do not fry foods. Cook foods using methods such as baking, boiling, grilling, and broiling instead.  When eating at a restaurant, ask that your food be prepared with less salt, or no salt if possible. What foods can I eat? Seek help from a dietitian for individual calorie needs. Grains  Whole grain or whole wheat bread. Brown rice. Whole grain or whole wheat pasta. Quinoa, bulgur, and whole grain cereals. Low-sodium cereals. Corn or whole wheat flour tortillas. Whole grain cornbread. Whole grain crackers. Low-sodium crackers. Vegetables  Fresh or frozen vegetables (raw, steamed, roasted, or  grilled). Low-sodium or reduced-sodium tomato and vegetable juices. Low-sodium or reduced-sodium tomato sauce and paste. Low-sodium or reduced-sodium canned vegetables. Fruits  All fresh, canned (in natural juice), or frozen fruits. Meat and Other Protein Products  Ground beef (85% or leaner), grass-fed beef, or beef trimmed of fat. Skinless chicken or turkey. Ground chicken or turkey. Pork trimmed of fat. All fish and seafood. Eggs. Dried beans, peas, or lentils. Unsalted nuts and seeds. Unsalted canned beans. Dairy  Low-fat dairy products, such as skim or 1% milk, 2% or reduced-fat cheeses, low-fat ricotta or cottage cheese, or plain low-fat yogurt. Low-sodium or reduced-sodium cheeses. Fats and Oils  Tub margarines without trans fats. Light or reduced-fat mayonnaise and salad dressings (reduced sodium). Avocado. Safflower, olive, or canola oils. Natural peanut or almond butter. Other  Unsalted popcorn and pretzels. The items listed above may not be a complete list of recommended foods or beverages. Contact your dietitian for more options.  What foods are not recommended? Grains  White bread. White pasta. White rice. Refined cornbread. Bagels and croissants. Crackers that contain trans fat. Vegetables  Creamed or fried vegetables. Vegetables in a cheese sauce. Regular canned vegetables. Regular canned tomato sauce and paste. Regular tomato and vegetable juices. Fruits  Canned fruit in light or heavy syrup. Fruit juice. Meat and Other Protein Products  Fatty cuts of meat. Ribs, chicken wings, bacon, sausage, bologna, salami, chitterlings, fatback, hot dogs, bratwurst, and packaged luncheon meats. Salted nuts and seeds. Canned beans with salt. Dairy  Whole or 2% milk, cream, half-and-half, and cream cheese. Whole-fat or sweetened yogurt. Full-fat cheeses   or blue cheese. Nondairy creamers and whipped toppings. Processed cheese, cheese spreads, or cheese curds. Condiments  Onion and garlic  salt, seasoned salt, table salt, and sea salt. Canned and packaged gravies. Worcestershire sauce. Tartar sauce. Barbecue sauce. Teriyaki sauce. Soy sauce, including reduced sodium. Steak sauce. Fish sauce. Oyster sauce. Cocktail sauce. Horseradish. Ketchup and mustard. Meat flavorings and tenderizers. Bouillon cubes. Hot sauce. Tabasco sauce. Marinades. Taco seasonings. Relishes. Fats and Oils  Butter, stick margarine, lard, shortening, ghee, and bacon fat. Coconut, palm kernel, or palm oils. Regular salad dressings. Other  Pickles and olives. Salted popcorn and pretzels. The items listed above may not be a complete list of foods and beverages to avoid. Contact your dietitian for more information.  Where can I find more information? National Heart, Lung, and Blood Institute: www.nhlbi.nih.gov/health/health-topics/topics/dash/ This information is not intended to replace advice given to you by your health care provider. Make sure you discuss any questions you have with your health care provider. Document Released: 09/11/2011 Document Revised: 02/28/2016 Document Reviewed: 07/27/2013 Elsevier Interactive Patient Education  2017 [employer] Inc.  

## 2016-09-12 NOTE — Progress Notes (Signed)
   BP (!) 143/79 (BP Location: Left Arm, Patient Position: Sitting, Cuff Size: Large)   Pulse 67   Temp 98 F (36.7 C)   Wt 241 lb 4.8 oz (109.5 kg)   SpO2 98%   BMI 33.56 kg/m    Subjective:    Patient ID: Daniel Martinez, male    DOB: 07/19/1991, 25 y.o.   MRN: [id number]  HPI: Daniel Martinez is a [age] y.o. male  Chief Complaint  Patient presents with  . Hypertension   HYPERTENSION Hypertension status: uncontrolled  Satisfied with current treatment? no Duration of hypertension: months BP monitoring frequency:  not checking BP range: consistently in the 140s BP medication side effects:  Not on anything Previous BP meds: none Aspirin: no Recurrent headaches: no Visual changes: no Palpitations: no Dyspnea: no Chest pain: no Lower extremity edema: no Dizzy/lightheaded: no  Relevant past medical, surgical, family and social history reviewed and updated as indicated. Interim medical history since our last visit reviewed. Allergies and medications reviewed and updated.  Review of Systems  Constitutional: Negative.   Respiratory: Negative.   Cardiovascular: Negative.   Psychiatric/Behavioral: Negative.     Per HPI unless specifically indicated above     Objective:    BP (!) 143/79 (BP Location: Left Arm, Patient Position: Sitting, Cuff Size: Large)   Pulse 67   Temp 98 F (36.7 C)   Wt 241 lb 4.8 oz (109.5 kg)   SpO2 98%   BMI 33.56 kg/m   Wt Readings from Last 3 Encounters:  09/12/16 241 lb 4.8 oz (109.5 kg)  09/03/16 239 lb (108.4 kg)  06/13/16 238 lb (108 kg)    Physical Exam  Constitutional: He is oriented to person, place, and time. He appears well-developed and well-nourished. No distress.  HENT:  Head: Normocephalic and atraumatic.  Right Ear: Hearing normal.  Left Ear: Hearing normal.  Nose: Nose normal.  Eyes: Conjunctivae and lids are normal. Right eye exhibits no discharge. Left eye exhibits no discharge. No scleral icterus.    Cardiovascular: Normal rate, regular rhythm, normal heart sounds and intact distal pulses.  Exam reveals no gallop and no friction rub.   No murmur heard. Pulmonary/Chest: Effort normal and breath sounds normal. No respiratory distress. He has no wheezes. He has no rales. He exhibits no tenderness.  Musculoskeletal: Normal range of motion.  Neurological: He is alert and oriented to person, place, and time.  Skin: Skin is warm, dry and intact. No rash noted. He is not diaphoretic. No erythema. No pallor.  Psychiatric: He has a normal mood and affect. His speech is normal and behavior is normal. Judgment and thought content normal. Cognition and memory are normal.  Nursing note and vitals reviewed.   Results for orders placed or performed in visit on 07/17/16  HM DIABETES EYE EXAM  Result Value Ref Range   HM Diabetic Eye Exam No Retinopathy No Retinopathy      Assessment & Plan:   Problem List Items Addressed This Visit      Cardiovascular and Mediastinum   Essential hypertension - Primary    No better. Will start him on 5mg  lisinopril. Recheck 1 month.       Relevant Medications   lisinopril (PRINIVIL,ZESTRIL) 5 MG tablet   Other Relevant Orders   Basic metabolic panel   Microalbumin, Urine Waived       Follow up plan: Return in about 4 weeks (around 10/10/2016) for BP follow up.

## 2016-09-13 LAB — BASIC METABOLIC PANEL
BUN/Creatinine Ratio: 13 (ref 9–20)
BUN: 13 mg/dL (ref 6–20)
CALCIUM: 9.8 mg/dL (ref 8.7–10.2)
CO2: 25 mmol/L (ref 18–29)
Chloride: 101 mmol/L (ref 96–106)
Creatinine, Ser: 0.99 mg/dL (ref 0.76–1.27)
GFR, EST AFRICAN AMERICAN: 122 mL/min/{1.73_m2} (ref >59)
GFR, EST NON AFRICAN AMERICAN: 105 mL/min/{1.73_m2} (ref >59)
Glucose: 98 mg/dL (ref 65–99)
POTASSIUM: 4.5 mmol/L (ref 3.5–5.2)
Sodium: 142 mmol/L (ref 134–144)

## 2016-10-10 ENCOUNTER — Ambulatory Visit (INDEPENDENT_AMBULATORY_CARE_PROVIDER_SITE_OTHER): Payer: 59 | Admitting: Family Medicine

## 2016-10-10 ENCOUNTER — Encounter: Payer: Self-pay | Admitting: Family Medicine

## 2016-10-10 VITALS — BP 140/86 | HR 73 | Temp 98.1°F | Ht 71.25 in | Wt 241.6 lb

## 2016-10-10 DIAGNOSIS — K5900 Constipation, unspecified: Secondary | ICD-10-CM | POA: Diagnosis not present

## 2016-10-10 DIAGNOSIS — I10 Essential (primary) hypertension: Secondary | ICD-10-CM

## 2016-10-10 MED ORDER — LISINOPRIL 10 MG PO TABS: 10.0000 mg | ORAL_TABLET | Freq: Every day | ORAL | 1 refills | Status: DC

## 2016-10-10 NOTE — Patient Instructions (Addendum)

## 2016-10-10 NOTE — Progress Notes (Signed)
BP 140/86   Pulse 73   Temp 98.1 F (36.7 C)   Ht 5' 11.25" (1.81 m)   Wt 241 lb 9.6 oz (109.6 kg)   SpO2 97%   BMI 33.46 kg/m    Subjective:    Patient ID: Daniel Martinez, male    DOB: [birth year], 26 y.o.   MRN: [id number]  HPI: Daniel Martinez is a 26 y.o. male  Chief Complaint  Patient presents with  . Follow-up  . Hypertension  . Irregular Bowel Movements   HYPERTENSION Hypertension status: uncontrolled  Satisfied with current treatment? no Duration of hypertension: chronic BP monitoring frequency:  not checking BP range:  BP medication side effects:  no Medication compliance: excellent compliance Previous BP meds: lisinopril Aspirin: no Recurrent headaches: no Visual changes: no Palpitations: no Dyspnea: no Chest pain: no Lower extremity edema: no Dizzy/lightheaded: no   ABDOMINAL ISSUES Duration: chronic Nature: going every day, but not completely emptying Location: R flank  Severity: 4/10 Radiation: no Episode duration: day or 2 Frequency: intermittent- usually about 1x a month Alleviating factors: miralax Aggravating factors: nothing Treatments attempted: laxatives Constipation: yes Diarrhea: no Mucous in the stool: no Heartburn: no Bloating:yes Flatulence: no Nausea: no Vomiting: no Melena or hematochezia: no Rash: no Jaundice: no Fever: no Weight loss: no  Relevant past medical, surgical, family and social history reviewed and updated as indicated. Interim medical history since our last visit reviewed. Allergies and medications reviewed and updated.  Review of Systems  Constitutional: Negative.   Respiratory: Negative.   Cardiovascular: Negative.   Gastrointestinal: Positive for abdominal pain and constipation. Negative for abdominal distention, anal bleeding, blood in stool, diarrhea, nausea, rectal pain and vomiting.  Psychiatric/Behavioral: Negative.     Per HPI unless specifically indicated above     Objective:      BP 140/86   Pulse 73   Temp 98.1 F (36.7 C)   Ht 5' 11.25" (1.81 m)   Wt 241 lb 9.6 oz (109.6 kg)   SpO2 97%   BMI 33.46 kg/m   Wt Readings from Last 3 Encounters:  10/10/16 241 lb 9.6 oz (109.6 kg)  09/12/16 241 lb 4.8 oz (109.5 kg)  09/03/16 239 lb (108.4 kg)    Physical Exam  Constitutional: He is oriented to person, place, and time. He appears well-developed and well-nourished. No distress.  HENT:  Head: Normocephalic and atraumatic.  Right Ear: Hearing normal.  Left Ear: Hearing normal.  Nose: Nose normal.  Eyes: Conjunctivae and lids are normal. Right eye exhibits no discharge. Left eye exhibits no discharge. No scleral icterus.  Cardiovascular: Normal rate, regular rhythm, normal heart sounds and intact distal pulses.  Exam reveals no gallop and no friction rub.   No murmur heard. Pulmonary/Chest: Effort normal and breath sounds normal. No respiratory distress. He has no wheezes. He has no rales. He exhibits no tenderness.  Abdominal: Soft. Bowel sounds are normal. He exhibits no distension and no mass. There is no tenderness. There is no rebound and no guarding.  Musculoskeletal: Normal range of motion.  Neurological: He is alert and oriented to person, place, and time.  Skin: Skin is warm, dry and intact. No rash noted. He is not diaphoretic. No erythema. No pallor.  Psychiatric: He has a normal mood and affect. His speech is normal and behavior is normal. Judgment and thought content normal. Cognition and memory are normal.  Nursing note and vitals reviewed.   Results for orders placed or performed in visit  on 09/12/16  Basic metabolic panel  Result Value Ref Range   Glucose 98 65 - 99 mg/dL   BUN 13 6 - 20 mg/dL   Creatinine, Ser 4.090.99 0.76 - 1.27 mg/dL   GFR calc non Af Amer 105 >59 mL/min/1.73   GFR calc Af Amer 122 >59 mL/min/1.73   BUN/Creatinine Ratio 13 9 - 20   Sodium 142 134 - 144 mmol/L   Potassium 4.5 3.5 - 5.2 mmol/L   Chloride 101 96 - 106 mmol/L    CO2 25 18 - 29 mmol/L   Calcium 9.8 8.7 - 10.2 mg/dL  Microalbumin, Urine Waived  Result Value Ref Range   Microalb, Ur Waived 30 (H) 0 - 19 mg/L   Creatinine, Urine Waived 300 10 - 300 mg/dL   Microalb/Creat Ratio <30 <30 mg/g      Assessment & Plan:   Problem List Items Addressed This Visit      Cardiovascular and Mediastinum   Essential hypertension - Primary    Not under good control. Increase lisinopril to 10mg  daily and recheck in 1 month.       Relevant Medications   lisinopril (PRINIVIL,ZESTRIL) 10 MG tablet   Other Relevant Orders   Basic metabolic panel    Other Visit Diagnoses    Constipation, unspecified constipation type       Increase fluids and better foods. OK to take miralax more often. Call with any concerns.        Follow up plan: Return in about 4 weeks (around 11/07/2016) for Follow up BP.

## 2016-10-10 NOTE — Assessment & Plan Note (Signed)
Not under good control. Increase lisinopril to 10mg  daily and recheck in 1 month.

## 2016-10-11 LAB — BASIC METABOLIC PANEL
BUN/Creatinine Ratio: 15 (ref 9–20)
BUN: 13 mg/dL (ref 6–20)
CALCIUM: 9.9 mg/dL (ref 8.7–10.2)
CHLORIDE: 96 mmol/L (ref 96–106)
CO2: 27 mmol/L (ref 18–29)
Creatinine, Ser: 0.87 mg/dL (ref 0.76–1.27)
GFR calc Af Amer: 139 mL/min/{1.73_m2} (ref >59)
GFR, EST NON AFRICAN AMERICAN: 120 mL/min/{1.73_m2} (ref >59)
Glucose: 97 mg/dL (ref 65–99)
POTASSIUM: 4.4 mmol/L (ref 3.5–5.2)
SODIUM: 140 mmol/L (ref 134–144)

## 2016-11-10 ENCOUNTER — Ambulatory Visit (INDEPENDENT_AMBULATORY_CARE_PROVIDER_SITE_OTHER): Payer: 59 | Admitting: Family Medicine

## 2016-11-10 ENCOUNTER — Encounter: Payer: Self-pay | Admitting: Family Medicine

## 2016-11-10 VITALS — BP 158/90 | HR 99 | Temp 99.4°F | Wt 244.2 lb

## 2016-11-10 DIAGNOSIS — I10 Essential (primary) hypertension: Secondary | ICD-10-CM | POA: Diagnosis not present

## 2016-11-10 MED ORDER — LISINOPRIL 20 MG PO TABS: 20.0000 mg | ORAL_TABLET | Freq: Every day | ORAL | 3 refills | Status: DC

## 2016-11-10 NOTE — Progress Notes (Signed)
BP (!) 158/90   Pulse 99   Temp 99.4 F (37.4 C)   Wt 244 lb 3.2 oz (110.8 kg)   BMI 33.82 kg/m    Subjective:    Patient ID: Daniel Martinez, male    DOB: 1990-10-29, 27 y.o.   MRN: [id number]  HPI: Daniel Martinez is a 26 y.o. male  Chief Complaint  Patient presents with  . Hypertension   HYPERTENSION Hypertension status: stable  Satisfied with current treatment? no Duration of hypertension: months BP monitoring frequency:  not checking BP medication side effects:  no Medication compliance: good compliance Previous BP meds: lisinopril Aspirin: no Recurrent headaches: no Visual changes: no Palpitations: no Dyspnea: no Chest pain: no Lower extremity edema: no Dizzy/lightheaded: no  Relevant past medical, surgical, family and social history reviewed and updated as indicated. Interim medical history since our last visit reviewed. Allergies and medications reviewed and updated.  Review of Systems  Constitutional: Positive for fatigue and fever. Negative for activity change, appetite change, chills, diaphoresis and unexpected weight change.  HENT: Negative.   Respiratory: Negative.   Cardiovascular: Negative.   Gastrointestinal: Positive for abdominal pain, diarrhea and nausea. Negative for abdominal distention, anal bleeding, blood in stool, constipation, rectal pain and vomiting.  Psychiatric/Behavioral: Negative.     Per HPI unless specifically indicated above     Objective:    BP (!) 158/90   Pulse 99   Temp 99.4 F (37.4 C)   Wt 244 lb 3.2 oz (110.8 kg)   BMI 33.82 kg/m   Wt Readings from Last 3 Encounters:  11/10/16 244 lb 3.2 oz (110.8 kg)  10/10/16 241 lb 9.6 oz (109.6 kg)  09/12/16 241 lb 4.8 oz (109.5 kg)    Physical Exam  Constitutional: He is oriented to person, place, and time. He appears well-developed and well-nourished. No distress.  HENT:  Head: Normocephalic and atraumatic.  Right Ear: Hearing normal.  Left Ear: Hearing normal.   Nose: Nose normal.  Eyes: Conjunctivae and lids are normal. Right eye exhibits no discharge. Left eye exhibits no discharge. No scleral icterus.  Cardiovascular: Normal rate, regular rhythm, normal heart sounds and intact distal pulses.  Exam reveals no gallop and no friction rub.   No murmur heard. Pulmonary/Chest: Effort normal and breath sounds normal. No respiratory distress. He has no wheezes. He has no rales. He exhibits no tenderness.  Musculoskeletal: Normal range of motion.  Neurological: He is alert and oriented to person, place, and time.  Skin: Skin is warm, dry and intact. No rash noted. No erythema. No pallor.  Psychiatric: He has a normal mood and affect. His speech is normal and behavior is normal. Judgment and thought content normal. Cognition and memory are normal.  Nursing note and vitals reviewed.   Results for orders placed or performed in visit on 10/10/16  Basic metabolic panel  Result Value Ref Range   Glucose 97 65 - 99 mg/dL   BUN 13 6 - 20 mg/dL   Creatinine, Ser 7.82 0.76 - 1.27 mg/dL   GFR calc non Af Amer 120 >59 mL/min/1.73   GFR calc Af Amer 139 >59 mL/min/1.73   BUN/Creatinine Ratio 15 9 - 20   Sodium 140 134 - 144 mmol/L   Potassium 4.4 3.5 - 5.2 mmol/L   Chloride 96 96 - 106 mmol/L   CO2 27 18 - 29 mmol/L   Calcium 9.9 8.7 - 10.2 mg/dL      Assessment & Plan:   Problem  List Items Addressed This Visit      Cardiovascular and Mediastinum   Essential hypertension - Primary    Not under good control. Will increase dose to 20mg  daily and recheck in 1 month.       Relevant Medications   lisinopril (PRINIVIL,ZESTRIL) 20 MG tablet   Other Relevant Orders   Basic metabolic panel       Follow up plan: Return in about 4 weeks (around 12/08/2016) for BP follow up.

## 2016-11-10 NOTE — Assessment & Plan Note (Signed)
Not under good control. Will increase dose to 20mg  daily and recheck in 1 month.

## 2016-11-11 LAB — BASIC METABOLIC PANEL
BUN / CREAT RATIO: 10 (ref 9–20)
BUN: 9 mg/dL (ref 6–20)
CALCIUM: 9.4 mg/dL (ref 8.7–10.2)
CO2: 24 mmol/L (ref 18–29)
Chloride: 98 mmol/L (ref 96–106)
Creatinine, Ser: 0.9 mg/dL (ref 0.76–1.27)
GFR, EST AFRICAN AMERICAN: 137 mL/min/{1.73_m2} (ref >59)
GFR, EST NON AFRICAN AMERICAN: 118 mL/min/{1.73_m2} (ref >59)
Glucose: 96 mg/dL (ref 65–99)
POTASSIUM: 4.3 mmol/L (ref 3.5–5.2)
Sodium: 141 mmol/L (ref 134–144)

## 2016-12-09 ENCOUNTER — Encounter: Payer: Self-pay | Admitting: Family Medicine

## 2016-12-09 ENCOUNTER — Ambulatory Visit (INDEPENDENT_AMBULATORY_CARE_PROVIDER_SITE_OTHER): Payer: 59 | Admitting: Family Medicine

## 2016-12-09 VITALS — BP 118/70 | HR 80 | Temp 98.3°F | Resp 17 | Ht 71.25 in | Wt 246.0 lb

## 2016-12-09 DIAGNOSIS — I10 Essential (primary) hypertension: Secondary | ICD-10-CM | POA: Diagnosis not present

## 2016-12-09 NOTE — Assessment & Plan Note (Signed)
Under good control on recheck. Continue current regimen. Continue to monitor. Call with any concerns.  

## 2016-12-09 NOTE — Progress Notes (Signed)
BP 118/70   Pulse 80   Temp 98.3 F (36.8 C) (Oral)   Resp 17   Ht 5' 11.25" (1.81 m)   Wt 246 lb (111.6 kg)   SpO2 96%   BMI 34.07 kg/m    Subjective:    Patient ID: Daniel Martinez, male    DOB: 23-Aug-1991, 26 y.o.   MRN: [id number]  HPI: [name] is a 26 y.o. male  Chief Complaint  Patient presents with  . Hypertension    F/U   HYPERTENSION Hypertension status: better  Satisfied with current treatment? yes Duration of hypertension: chronic BP monitoring frequency:  not checking BP medication side effects:  no Medication compliance: excellent compliance Previous BP meds: lisinopril Aspirin: no Recurrent headaches: no Visual changes: no Palpitations: no Dyspnea: no Chest pain: no Lower extremity edema: no Dizzy/lightheaded: no   Relevant past medical, surgical, family and social history reviewed and updated as indicated. Interim medical history since our last visit reviewed. Allergies and medications reviewed and updated.  Review of Systems  Constitutional: Negative.   Respiratory: Negative.   Cardiovascular: Negative.   Psychiatric/Behavioral: Negative.     Per HPI unless specifically indicated above     Objective:    BP 118/70   Pulse 80   Temp 98.3 F (36.8 C) (Oral)   Resp 17   Ht 5' 11.25" (1.81 m)   Wt 246 lb (111.6 kg)   SpO2 96%   BMI 34.07 kg/m   Wt Readings from Last 3 Encounters:  12/09/16 246 lb (111.6 kg)  11/10/16 244 lb 3.2 oz (110.8 kg)  10/10/16 241 lb 9.6 oz (109.6 kg)    Physical Exam  Constitutional: He is oriented to person, place, and time. He appears well-developed and well-nourished. No distress.  HENT:  Head: Normocephalic and atraumatic.  Right Ear: Hearing normal.  Left Ear: Hearing normal.  Nose: Nose normal.  Eyes: Conjunctivae and lids are normal. Right eye exhibits no discharge. Left eye exhibits no discharge. No scleral icterus.  Cardiovascular: Normal rate, regular rhythm, normal heart sounds  and intact distal pulses.  Exam reveals no gallop and no friction rub.   No murmur heard. Pulmonary/Chest: Effort normal and breath sounds normal. No respiratory distress. He has no wheezes. He has no rales. He exhibits no tenderness.  Musculoskeletal: Normal range of motion.  Neurological: He is alert and oriented to person, place, and time.  Skin: Skin is warm, dry and intact. No rash noted. No erythema. No pallor.  Psychiatric: He has a normal mood and affect. His speech is normal and behavior is normal. Judgment and thought content normal. Cognition and memory are normal.  Nursing note and vitals reviewed.   Results for orders placed or performed in visit on 11/10/16  Basic metabolic panel  Result Value Ref Range   Glucose 96 65 - 99 mg/dL   BUN 9 6 - 20 mg/dL   Creatinine, Ser 4.09 0.76 - 1.27 mg/dL   GFR calc non Af Amer 118 >59 mL/min/1.73   GFR calc Af Amer 137 >59 mL/min/1.73   BUN/Creatinine Ratio 10 9 - 20   Sodium 141 134 - 144 mmol/L   Potassium 4.3 3.5 - 5.2 mmol/L   Chloride 98 96 - 106 mmol/L   CO2 24 18 - 29 mmol/L   Calcium 9.4 8.7 - 10.2 mg/dL      Assessment & Plan:   Problem List Items Addressed This Visit      Cardiovascular and Mediastinum  Essential hypertension - Primary    Under good control on recheck. Continue current regimen. Continue to monitor. Call with any concerns.       Relevant Orders   Basic metabolic panel       Follow up plan: Return in about 6 months (around 06/11/2017) for Physical.

## 2016-12-09 NOTE — Patient Instructions (Addendum)
Lactose Intolerance, Adult Lactose is the natural sugar found in milk and milk products, such as cheese and yogurt. Lactose is digested by lactase, an enzyme in your small intestine. Some people do not produce enough lactase to digest lactose. This is called lactose intolerance. Lactose intolerance is different from milk allergy, which is a more serious reaction to the protein in milk. What are the causes? Causes of lactose intolerance may include:  Normal aging. The ability to produce lactase may decline with age, causing lactose intolerance over time.  Being born without the ability to make lactase.  Digestive diseases such as gastroenteritis or inflammatory bowel disease.  Surgery or injuries to your small intestine.  Infection in your intestines.  Certain antibiotic medicines and cancer treatments. What are the signs or symptoms? Lactose intolerance can cause uncomfortable symptoms. These are likely to occur within 30 minutes to 2 hours after eating or drinking foods containing lactose. Symptoms of lactose intolerance may include:  Nausea.  Diarrhea.  Abdominal cramps or pain.  Bloating.  Gas. How is this diagnosed? There are several tests your health care provider can do to diagnose lactose intolerance. These tests include a hydrogen breath test and stool acidity test. How is this treated? No treatment can improve your body's ability to produce lactase. However, your symptoms can be controlled by limiting or avoiding milk products and other sources of lactose and adjusting your diet. Lactose-free milk is often tolerated. Lactose digestion may also be improved by adding lactase drops to regular milk or by taking lactase tablets when dairy products are consumed. Tolerance to lactose is individual. Some people may be able to eat or drink small amounts of products with lactose, while other may need to avoid lactose entirely. Talk to your health care provider about what is best for  you. Follow these instructions at home:  Limit or avoidfoods, beverages, and medicines containing lactose as directed by your health care provider.  Read food and medicine labels carefully to avoid products containing lactose, milk solids, casein, or whey.  If you eliminate dairy products, replace the protein, calcium, vitamin D, and other nutrients they contain through other foods. A registered dietitian or your health care provider can help you adjust your diet.  Choose a milk substitute that is fortified with calcium and vitamin D. Be aware that soy milk contains high quality protein, while milks made from nuts or grains contain very little protein.  Use lactase drops or tablets if directed by your health care provider. Contact a health care provider if: You have no relief from your symptoms after eliminating milk products and other sources of lactose. This information is not intended to replace advice given to you by your health care provider. Make sure you discuss any questions you have with your health care provider. Document Released: 09/22/2005 Document Revised: 02/28/2016 Document Reviewed: 12/23/2013 Elsevier Interactive Patient Education  2017 [employer] Lactose-Free Diet, Adult If you have lactose intolerance, you are not able to digest lactose. Lactose is a natural sugar found mainly in milk and milk products. You may need to avoid all foods and beverages that contain lactose. A lactose-free diet can help you do this. What do I need to know about this diet?  Do not consume foods, beverages, vitamins, minerals, or medicines with lactose. Read ingredients lists carefully.  Look for the words "lactose-free" on labels.  Use lactase enzyme drops or tablets as directed by your health care provider.  Use lactose-free milk or a milk alternative, such  as soy milk, for drinking and cooking.  Make sure you get enough calcium and vitamin D in your diet. A lactose-free eating plan  can be lacking in these important nutrients.  Take calcium and vitamin D supplements as directed by your health care provider. Talk to your provider about supplements if you are not able to get enough calcium and vitamin D from food. Which foods have lactose? Lactose is found in:  Milk and foods made from milk.  Yogurt.  Cheese.  Butter.  Margarine.  Sour cream.  Cream.  Whipped toppings and nondairy creamers.  Ice cream and other milk-based desserts. Lactose is also found in foods or products made with milk or milk ingredients. To find out whether a food contains milk or a milk ingredient, look at the ingredients list. Avoid foods with the statement "May contain milk" and foods that contain:  Butter.  Cream.  Milk.  Milk solids.  Milk powder.  Whey.  Curd.  Caseinate.  Lactose.  Lactalbumin.  Lactoglobulin. What are some alternatives to milk and foods made with milk products?  Lactose-free milk.  Soy milk with added calcium and vitamin D.  Almond, coconut, or rice milk with added calcium and vitamin D. Note that these are low in protein.  Soy products, such as soy yogurt, soy cheese, soy ice cream, and soy-based sour cream. Which foods can I eat? Grains  Breads and rolls made without milk, such as [place], [place], or [place] bread, bagels, pita, and [employer]. Corn tortillas, corn meal, grits, and polenta. Crackers without lactose or milk solids, such as soda crackers and graham crackers. Cooked or dry cereals without lactose or milk solids. Pasta, quinoa, couscous, barley, oats, bulgur, farro, rice, wild rice, or other grains prepared without milk or lactose. Plain popcorn. Vegetables  Fresh, frozen, and canned vegetables without cheese, cream, or butter sauces. Fruits  All fresh, canned, frozen, or dried fruits that are not processed with lactose. Meats and Other Protein Sources  Plain beef, chicken, fish, [place], lamb, veal, pork, wild game, or ham.  Kosher-prepared meat products. Strained or junior meats that do not contain milk. Eggs. Soy meat substitutes. Beans, lentils, and hummus. Tofu. Nuts and seeds. Peanut or other nut butters without lactose. Soups, casseroles, and mixed dishes without cheese, cream, or milk. Dairy  Lactose-free milk. Soy, rice, or almond milk with added calcium and vitamin D. Soy cheese and yogurt. Beverages  Carbonated drinks. Tea. Coffee, freeze-dried coffee, and some instant coffees. Fruit and vegetable juices. Condiments  Soy sauce. Carob powder. Olives. Gravy made with water. Baker's cocoa. Rosita Fire. Pure seasonings and spices. Ketchup. Mustard. Bouillon. Broth. Sweets and Desserts  Water and fruit ices. Gelatin. Cookies, pies, or cakes made from allowed ingredients, such as angel food cake. Pudding made with water or a milk substitute. Lactose-free tofu desserts. Soy, coconut milk, or rice-milk-based frozen desserts. Sugar. Honey. Jam, jelly, and marmalade. Molasses. Pure sugar candy. Dark chocolate without milk. Marshmallows. Fats and Oils  Margarines and salad dressings that do not contain milk. Tomasa Blase. Vegetable oils. Shortening. Mayonnaise. Soy or coconut-based cream. The items listed above may not be a complete list of recommended foods or beverages. Contact your dietitian for more options.  Which foods are not recommended? Grains  Breads and rolls that contain milk. Toaster pastries. Muffins, biscuits, waffles, cornbread, and pancakes. These can be prepared at home, commercial, or from mixes. Sweet rolls, donuts, English muffins, fry bread, lefse, flour tortillas with lactose, or [place] toast made with milk or  milk ingredients. Crackers that contain lactose. Corn curls. Cooked or dry cereals with lactose. Vegetables  Creamed or breaded vegetables. Vegetables in a cheese or butter sauce or with lactose-containing margarines. Instant potatoes. [place] fries. Scalloped or au gratin potatoes. Fruits  None. Meats  and Other Protein Sources  Scrambled eggs, omelets, and souffles that contain milk. Creamed or breaded meat, fish, chicken, or [place]. Sausage products, such as wieners and liver sausage. Cold cuts that contain milk solids. Cheese, cottage cheese, ricotta cheese, and cheese spreads. Lasagna and macaroni and cheese. Pizza. Peanut or other nut butters with added milk solids. Casseroles or mixed dishes containing milk or cheese. Dairy  All dairy products, including milk, goat's milk, buttermilk, kefir, acidophilus milk, flavored milk, evaporated milk, condensed milk, dulce de [place], eggnog, yogurt, cheese, and cheese spreads. Beverages  Hot chocolate. Cocoa with lactose. Instant iced teas. Powdered fruit drinks. Smoothies made with milk or yogurt. Condiments  Chewing gum that has lactose. Cocoa that has lactose. Spice blends if they contain milk products. Artificial sweeteners that contain lactose. Nondairy creamers. Sweets and Desserts  Ice cream, ice milk, gelato, sherbet, and frozen yogurt. Custard, pudding, and mousse. Cake, cream pies, cookies, and other desserts containing milk, cream, cream cheese, or milk chocolate. Pie crust made with milk-containing margarine or butter. Reduced-calorie desserts made with a sugar substitute that contains lactose. Toffee and butterscotch. Milk, white, or dark chocolate that contains milk. Fudge. Caramel. Fats and Oils  Margarines and salad dressings that contain milk or cheese. Cream. Half and half. Cream cheese. Sour cream. Chip dips made with sour cream or yogurt. The items listed above may not be a complete list of foods and beverages to avoid. Contact your dietitian for more information.  Am I getting enough calcium? Calcium is found in many foods that contain lactose and is important for bone health. The amount of calcium you need depends on your age:  Adults younger than 50 years: 1000 mg of calcium a day.  Adults older than 50 years: 1200 mg of calcium  a day. If you are not getting enough calcium, other calcium sources include:  Orange juice with calcium added. There are 300-350 mg of calcium in 1 cup of orange juice.  Sardines with edible bones. There are 325 mg of calcium in 3 oz of sardines.  Calcium-fortified soy milk. There are 300-400 mg of calcium in 1 cup of calcium-fortified soy milk.  Calcium-fortified rice or almond milk. There are 300 mg of calcium in 1 cup of calcium-fortified rice or almond milk.  Canned salmon with edible bones. There are 180 mg of calcium in 3 oz of canned salmon with edible bones.  Calcium-fortified breakfast cereals. There are [phone] mg of calcium in calcium-fortified breakfast cereals.  Tofu set with calcium sulfate. There are 250 mg of calcium in  cup of tofu set with calcium sulfate.  Spinach, cooked. There are 145 mg of calcium in  cup of cooked spinach.  Edamame, cooked. There are 130 mg of calcium in  cup of cooked edamame.  Collard greens, cooked. There are 125 mg of calcium in  cup of cooked collard greens.  Kale, frozen or cooked. There are 90 mg of calcium in  cup of cooked or frozen kale.  Almonds. There are 95 mg of calcium in  cup of almonds.  Broccoli, cooked. There are 60 mg of calcium in 1 cup of cooked broccoli. This information is not intended to replace advice given to you by your health  care provider. Make sure you discuss any questions you have with your health care provider. Document Released: 03/14/2002 Document Revised: 02/28/2016 Document Reviewed: 12/23/2013 Elsevier Interactive Patient Education  2017 [employer]

## 2016-12-10 LAB — BASIC METABOLIC PANEL
BUN / CREAT RATIO: 16 (ref 9–20)
BUN: 14 mg/dL (ref 6–20)
CHLORIDE: 99 mmol/L (ref 96–106)
CO2: 24 mmol/L (ref 18–29)
Calcium: 9.8 mg/dL (ref 8.7–10.2)
Creatinine, Ser: 0.89 mg/dL (ref 0.76–1.27)
GFR calc non Af Amer: 119 mL/min/{1.73_m2} (ref >59)
GFR, EST AFRICAN AMERICAN: 137 mL/min/{1.73_m2} (ref >59)
GLUCOSE: 94 mg/dL (ref 65–99)
Potassium: 4.5 mmol/L (ref 3.5–5.2)
SODIUM: 141 mmol/L (ref 134–144)

## 2017-01-27 ENCOUNTER — Ambulatory Visit (INDEPENDENT_AMBULATORY_CARE_PROVIDER_SITE_OTHER): Payer: 59 | Admitting: Family Medicine

## 2017-01-27 ENCOUNTER — Encounter: Payer: Self-pay | Admitting: Family Medicine

## 2017-01-27 VITALS — BP 145/75 | HR 91 | Temp 98.3°F | Wt 249.3 lb

## 2017-01-27 DIAGNOSIS — J029 Acute pharyngitis, unspecified: Secondary | ICD-10-CM | POA: Diagnosis not present

## 2017-01-27 DIAGNOSIS — H6123 Impacted cerumen, bilateral: Secondary | ICD-10-CM

## 2017-01-27 NOTE — Patient Instructions (Signed)
Follow up as needed

## 2017-01-27 NOTE — Progress Notes (Signed)
BP (!) 145/75 (BP Location: Right Arm, Patient Position: Sitting, Cuff Size: Large)   Pulse 91   Temp 98.3 F (36.8 C)   Wt 249 lb 4.8 oz (113.1 kg)   SpO2 99%   BMI 34.53 kg/m    Subjective:    Patient ID: Daniel Martinez, male    DOB: 10-01-1991, 26 y.o.   MRN: [id number]  HPI: Daniel Martinez is a 26 y.o. male  Chief Complaint  Patient presents with  . Sore Throat    Right side. x's 3 days  . Ear Pain    Right ear. Started yesterday.    Patient presents with 3 days of intermittent sore throat and right ear pain that started up yesterday. Ear pain is achy and feels like there is too much wax in there. States his throat has felt swollen and scratchy off and on, believes it to be irritant related due to his work environment but did have a positive strep culture several months ago with similar symptoms. Noticed a "cyst" in the back of his throat on the right this morning that wasn't there previously. Denies fever, chills, aches, painful lymph nodes. Has not tried anything OTC for sxs and feels better today. Denies sick contacts.   Relevant past medical, surgical, family and social history reviewed and updated as indicated. Interim medical history since our last visit reviewed. Allergies and medications reviewed and updated.  Review of Systems  Constitutional: Negative.   HENT: Positive for ear pain and sore throat. Negative for ear discharge, postnasal drip and rhinorrhea.   Eyes: Negative.   Respiratory: Negative.   Cardiovascular: Negative.   Gastrointestinal: Negative.   Musculoskeletal: Negative.   Neurological: Negative.   Psychiatric/Behavioral: Negative.     Per HPI unless specifically indicated above     Objective:    BP (!) 145/75 (BP Location: Right Arm, Patient Position: Sitting, Cuff Size: Large)   Pulse 91   Temp 98.3 F (36.8 C)   Wt 249 lb 4.8 oz (113.1 kg)   SpO2 99%   BMI 34.53 kg/m   Wt Readings from Last 3 Encounters:  01/27/17 249 lb 4.8  oz (113.1 kg)  12/09/16 246 lb (111.6 kg)  11/10/16 244 lb 3.2 oz (110.8 kg)    Physical Exam  Constitutional: He is oriented to person, place, and time. He appears well-developed and well-nourished.  HENT:  Head: Atraumatic.  B/l EACs with significant cerumen build-up Oropharynx moderately erythematous b/l, .5 cm ulcer present on right oropharynx  Eyes: Conjunctivae are normal. Pupils are equal, round, and reactive to light.  Neck: Normal range of motion. Neck supple.  Cardiovascular: Normal rate and normal heart sounds.   Pulmonary/Chest: Effort normal and breath sounds normal. No respiratory distress.  Musculoskeletal: Normal range of motion.  Lymphadenopathy:    He has no cervical adenopathy.  Neurological: He is alert and oriented to person, place, and time.  Skin: Skin is warm and dry.  Psychiatric: He has a normal mood and affect. His behavior is normal.  Nursing note and vitals reviewed.  Procedure: B/l ear lavage Procedure as explained at length with all questions from pt adequately answered. Ears were lavaged using water pick machine and warm water. The majority of the cerumen was removed successfully and TMs visualized B/l. No immediate complications, pt tolerated procedure well.      Assessment & Plan:   Problem List Items Addressed This Visit    None    Visit Diagnoses  Sore throat    -  Primary   Seems to be bothering him minimally today. Rapid strep neg, await cx results. Salt water gargles, tylenol, ibuprofen, chlorasceptic spray prn.   Relevant Orders   Rapid strep screen (not at [facility])   Bilateral impacted cerumen       Lavage performed today b/l with good relief. Discussed regular use of Debrox drops and warm shower water for maintenance.        Follow up plan: Return if symptoms worsen or fail to improve.

## 2017-01-30 LAB — RAPID STREP SCREEN (MED CTR MEBANE ONLY): Strep Gp A Ag, IA W/Reflex: NEGATIVE

## 2017-01-30 LAB — CULTURE, GROUP A STREP: Strep A Culture: NEGATIVE

## 2017-02-17 ENCOUNTER — Encounter: Payer: Self-pay | Admitting: Family Medicine

## 2017-02-17 ENCOUNTER — Ambulatory Visit (INDEPENDENT_AMBULATORY_CARE_PROVIDER_SITE_OTHER): Payer: 59 | Admitting: Family Medicine

## 2017-02-17 VITALS — BP 134/75 | HR 109 | Temp 98.4°F | Wt 250.3 lb

## 2017-02-17 DIAGNOSIS — K602 Anal fissure, unspecified: Secondary | ICD-10-CM

## 2017-02-17 DIAGNOSIS — K5900 Constipation, unspecified: Secondary | ICD-10-CM

## 2017-02-17 MED ORDER — POLYETHYLENE GLYCOL 3350 17 G PO PACK: 17.0000 g | PACK | Freq: Every day | ORAL | 0 refills | Status: DC

## 2017-02-17 NOTE — Progress Notes (Signed)
BP 134/75 (BP Location: Left Arm, Patient Position: Sitting, Cuff Size: Large)   Pulse (!) 109   Temp 98.4 F (36.9 C)   Wt 250 lb 4.8 oz (113.5 kg)   SpO2 99%   BMI 34.67 kg/m    Subjective:    Patient ID: Daniel Martinez Kolle, male    DOB: March 23, 1991, 26 y.o.   MRN: [id number]  HPI: Daniel Martinez Singleterry is a 26 y.o. male  Chief Complaint  Patient presents with  . Rectal Bleeding    Patient states that he had bright red rectal bleeding last night with a bowel movement, when he used the bathroom today it was fine.   RECTAL BLEEDING Duration: 1x last night Bright red rectal bleeding: yes  Amount of blood: spotting  Frequency: 1x Melena: no  Spotting on toilet tissue: yes  Anal fullness: no  Perianal pain: no  Severity: moderate Perianal irritation/itching: no  Constipation: yes  Chronic straining/valsava:  unsure  Anal trauma/intercourse: no  Hemorrhoids: no  Previous colonoscopy: no    Relevant past medical, surgical, family and social history reviewed and updated as indicated. Interim medical history since our last visit reviewed. Allergies and medications reviewed and updated.  Review of Systems  Constitutional: Negative.   Respiratory: Negative.   Cardiovascular: Negative.   Gastrointestinal: Negative.   Psychiatric/Behavioral: Negative.     Per HPI unless specifically indicated above     Objective:    BP 134/75 (BP Location: Left Arm, Patient Position: Sitting, Cuff Size: Large)   Pulse (!) 109   Temp 98.4 F (36.9 C)   Wt 250 lb 4.8 oz (113.5 kg)   SpO2 99%   BMI 34.67 kg/m   Wt Readings from Last 3 Encounters:  02/17/17 250 lb 4.8 oz (113.5 kg)  01/27/17 249 lb 4.8 oz (113.1 kg)  12/09/16 246 lb (111.6 kg)    Physical Exam  Constitutional: He is oriented to person, place, and time. He appears well-developed and well-nourished. No distress.  HENT:  Head: Normocephalic and atraumatic.  Right Ear: Hearing normal.  Left Ear: Hearing normal.    Nose: Nose normal.  Eyes: Conjunctivae and lids are normal. Right eye exhibits no discharge. Left eye exhibits no discharge. No scleral icterus.  Pulmonary/Chest: Effort normal. No respiratory distress.  Genitourinary:     Musculoskeletal: Normal range of motion.  Neurological: He is alert and oriented to person, place, and time.  Skin: Skin is warm, dry and intact. No rash noted. No erythema.  Psychiatric: He has a normal mood and affect. His speech is normal and behavior is normal. Judgment and thought content normal. Cognition and memory are normal.  Nursing note and vitals reviewed.   Results for orders placed or performed in visit on 01/27/17  Rapid [facility] screen ([facility] at [facility])  Result Value Ref Range   Strep Gp A Ag, IA Martinez/Reflex Negative Negative  Culture, Group A Strep  Result Value Ref Range   Strep A Culture Negative       Assessment & Plan:   Problem List Items Addressed This Visit    None    Visit Diagnoses    Anal fissure    -  Primary   Will treat with sitz baths and miralax. Call if not getting better or getting worse.    Constipation, unspecified constipation type       Will start miralax PRN. Call with any concerns. Increase fluids.        Follow up plan: Return if  symptoms worsen or fail to improve.

## 2017-02-17 NOTE — Patient Instructions (Addendum)
Anal Fissure, Adult An anal fissure is a small tear or crack in the skin around the opening of the butt (anus).Bleeding from the tear or crack usually stops on its own within a few minutes. The bleeding may happen every time you poop (have a bowel movement) until the tear or crack heals. Follow these instructions at home: Eating and drinking   Avoid bananas and dairy products. These foods can make it hard to poop.  Drink enough fluid to keep your pee (urine) clear or pale yellow.  Eat a lot of fruit, whole grains, and vegetables. General instructions   Keep the butt area as clean and dry as you can.  Take a warm water bath (sitz bath) as told by your doctor. Do not use soap.  Take over-the-counter and prescription medicines only as told by your doctor.  Use creams or ointments only as told by your doctor.  Keep all follow-up visits as told by your doctor. This is important. Contact a doctor if:  You have more bleeding.  You have a fever.  You have watery poop (diarrhea) that is mixed with blood.  You have pain.  You problem gets worse, not better. This information is not intended to replace advice given to you by your health care provider. Make sure you discuss any questions you have with your health care provider. Document Released: 05/21/2011 Document Revised: 02/28/2016 Document Reviewed: 12/18/2014 Elsevier Interactive Patient Education  2017 [employer]  How to Take a [employer] Bath A sitz bath is a warm water bath that is taken while you are sitting down. The water should only come up to your hips and should cover your buttocks. Your health care provider may recommend a sitz bath to help you:  Clean the lower part of your body, including your genital area.  With itching.  With pain.  With sore muscles or muscles that tighten or spasm. How to take a sitz bath Take 3-4 sitz baths per day or as told by your health care provider. 1. Partially fill a bathtub with warm  water. You will only need the water to be deep enough to cover your hips and buttocks when you are sitting in it. 2. If your health care provider told you to put medicine in the water, follow the directions exactly. 3. Sit in the water and open the tub drain a little. 4. Turn on the warm water again to keep the tub at the correct level. Keep the water running constantly. 5. Soak in the water for 15-20 minutes or as told by your health care provider. 6. After the sitz bath, pat the affected area dry first. Do not rub it. 7. Be careful when you stand up after the sitz bath because you may feel dizzy. Contact a health care provider if:  Your symptoms get worse. Do not continue with sitz baths if your symptoms get worse.  You have new symptoms. Do not continue with sitz baths until you talk with your health care provider. This information is not intended to replace advice given to you by your health care provider. Make sure you discuss any questions you have with your health care provider. Document Released: 06/14/2004 Document Revised: 02/20/2016 Document Reviewed: 09/20/2014 Elsevier Interactive Patient Education  2017 [employer]

## 2017-04-26 ENCOUNTER — Other Ambulatory Visit: Payer: Self-pay | Admitting: Family Medicine

## 2017-06-11 ENCOUNTER — Ambulatory Visit (INDEPENDENT_AMBULATORY_CARE_PROVIDER_SITE_OTHER): Payer: BLUE CROSS/BLUE SHIELD | Admitting: Family Medicine

## 2017-06-11 ENCOUNTER — Encounter: Payer: Self-pay | Admitting: Family Medicine

## 2017-06-11 VITALS — BP 124/62 | HR 88 | Temp 98.6°F | Ht 71.0 in | Wt 251.3 lb

## 2017-06-11 DIAGNOSIS — L309 Dermatitis, unspecified: Secondary | ICD-10-CM | POA: Diagnosis not present

## 2017-06-11 DIAGNOSIS — Z23 Encounter for immunization: Secondary | ICD-10-CM | POA: Diagnosis not present

## 2017-06-11 DIAGNOSIS — Z113 Encounter for screening for infections with a predominantly sexual mode of transmission: Secondary | ICD-10-CM

## 2017-06-11 DIAGNOSIS — Z0000 Encounter for general adult medical examination without abnormal findings: Secondary | ICD-10-CM

## 2017-06-11 DIAGNOSIS — I10 Essential (primary) hypertension: Secondary | ICD-10-CM | POA: Diagnosis not present

## 2017-06-11 LAB — MICROSCOPIC EXAMINATION
BACTERIA UA: NONE SEEN
RBC MICROSCOPIC, UA: NONE SEEN /HPF (ref 0–?)

## 2017-06-11 LAB — UA/M W/RFLX CULTURE, ROUTINE
Bilirubin, UA: NEGATIVE
Glucose, UA: NEGATIVE
Ketones, UA: NEGATIVE
Leukocytes, UA: NEGATIVE
NITRITE UA: NEGATIVE
Protein, UA: NEGATIVE
RBC, UA: NEGATIVE
Specific Gravity, UA: 1.02 (ref 1.005–1.030)
UUROB: 0.2 mg/dL (ref 0.2–1.0)
pH, UA: 6 (ref 5.0–7.5)

## 2017-06-11 LAB — MICROALBUMIN, URINE WAIVED
Creatinine, Urine Waived: 200 mg/dL (ref 10–300)
Microalb, Ur Waived: 10 mg/L (ref 0–19)
Microalb/Creat Ratio: <30 mg/g (ref <30)

## 2017-06-11 MED ORDER — LISINOPRIL 20 MG PO TABS: 20.0000 mg | ORAL_TABLET | Freq: Every day | ORAL | 1 refills | Status: DC

## 2017-06-11 MED ORDER — TRIAMCINOLONE ACETONIDE 0.5 % EX OINT: 1.0000 "application " | TOPICAL_OINTMENT | Freq: Two times a day (BID) | CUTANEOUS | 12 refills | Status: DC

## 2017-06-11 NOTE — Patient Instructions (Addendum)
Influenza (Flu) Vaccine (Inactivated or Recombinant): What You Need to Know 1. Why get vaccinated? Influenza ("flu") is a contagious disease that spreads around the [place] every year, usually between October and May. Flu is caused by influenza viruses, and is spread mainly by coughing, sneezing, and close contact. Anyone can get flu. Flu strikes suddenly and can last several days. Symptoms vary by age, but can include:  fever/chills  sore throat  muscle aches  fatigue  cough  headache  runny or stuffy nose  Flu can also lead to pneumonia and blood infections, and cause diarrhea and seizures in children. If you have a medical condition, such as heart or lung disease, flu can make it worse. Flu is more dangerous for some people. Infants and young children, people 65 years of age and older, pregnant women, and people with certain health conditions or a weakened immune system are at greatest risk. Each year thousands of people in the [place] die from flu, and many more are hospitalized. Flu vaccine can:  keep you from getting flu,  make flu less severe if you do get it, and  keep you from spreading flu to your family and other people. 2. Inactivated and recombinant flu vaccines A dose of flu vaccine is recommended every flu season. Children 6 months through 8 years of age may need two doses during the same flu season. Everyone else needs only one dose each flu season. Some inactivated flu vaccines contain a very small amount of a mercury-based preservative called thimerosal. Studies have not shown thimerosal in vaccines to be harmful, but flu vaccines that do not contain thimerosal are available. There is no live flu virus in flu shots. They cannot cause the flu. There are many flu viruses, and they are always changing. Each year a new flu vaccine is made to protect against three or four viruses that are likely to cause disease in the upcoming flu season. But even when the  vaccine doesn't exactly match these viruses, it may still provide some protection. Flu vaccine cannot prevent:  flu that is caused by a virus not covered by the vaccine, or  illnesses that look like flu but are not.  It takes about 2 weeks for protection to develop after vaccination, and protection lasts through the flu season. 3. Some people should not get this vaccine Tell the person who is giving you the vaccine:  If you have any severe, life-threatening allergies. If you ever had a life-threatening allergic reaction after a dose of flu vaccine, or have a severe allergy to any part of this vaccine, you may be advised not to get vaccinated. Most, but not all, types of flu vaccine contain a small amount of egg protein.  If you ever had Guillain-Barr Syndrome (also called GBS). Some people with a history of GBS should not get this vaccine. This should be discussed with your doctor.  If you are not feeling well. It is usually okay to get flu vaccine when you have a mild illness, but you might be asked to come back when you feel better.  4. Risks of a vaccine reaction With any medicine, including vaccines, there is a chance of reactions. These are usually mild and go away on their own, but serious reactions are also possible. Most people who get a flu shot do not have any problems with it. Minor problems following a flu shot include:  soreness, redness, or swelling where the shot was given  hoarseness  sore,   red or itchy eyes  cough  fever  aches  headache  itching  fatigue  If these problems occur, they usually begin soon after the shot and last 1 or 2 days. More serious problems following a flu shot can include the following:  There may be a small increased risk of Guillain-Barre Syndrome (GBS) after inactivated flu vaccine. This risk has been estimated at 1 or 2 additional cases per million people vaccinated. This is much lower than the risk of severe complications from  flu, which can be prevented by flu vaccine.  Young children who get the flu shot along with pneumococcal vaccine (PCV13) and/or DTaP vaccine at the same time might be slightly more likely to have a seizure caused by fever. Ask your doctor for more information. Tell your doctor if a child who is getting flu vaccine has ever had a seizure.  Problems that could happen after any injected vaccine:  People sometimes faint after a medical procedure, including vaccination. Sitting or lying down for about 15 minutes can help prevent fainting, and injuries caused by a fall. Tell your doctor if you feel dizzy, or have vision changes or ringing in the ears.  Some people get severe pain in the shoulder and have difficulty moving the arm where a shot was given. This happens very rarely.  Any medication can cause a severe allergic reaction. Such reactions from a vaccine are very rare, estimated at about 1 in a million doses, and would happen within a few minutes to a few hours after the vaccination. As with any medicine, there is a very remote chance of a vaccine causing a serious injury or death. The safety of vaccines is always being monitored. For more information, visit: http://www.aguilar.org/ 5. What if there is a serious reaction? What should I look for? Look for anything that concerns you, such as signs of a severe allergic reaction, very high fever, or unusual behavior. Signs of a severe allergic reaction can include hives, swelling of the face and throat, difficulty breathing, a fast heartbeat, dizziness, and weakness. These would start a few minutes to a few hours after the vaccination. What should I do?  If you think it is a severe allergic reaction or other emergency that can't wait, call 9-1-1 and get the person to the nearest hospital. Otherwise, call your doctor.  Reactions should be reported to the Vaccine Adverse Event Reporting System (VAERS). Your doctor should file this report, or you  can do it yourself through the VAERS web site at www.vaers.SamedayNews.es, or by calling 6094730752. ? VAERS does not give medical advice. 6. The National Vaccine Injury Compensation Program The [employer] Vaccine Injury Compensation Program (VICP) is a federal program that was created to compensate people who may have been injured by certain vaccines. Persons who believe they may have been injured by a vaccine can learn about the program and about filing a claim by calling 458-267-6070 or visiting the [employer] website at GoldCloset.com.ee. There is a time limit to file a claim for compensation. 7. How can I learn more?  Ask your healthcare provider. He or she can give you the vaccine package insert or suggest other sources of information.  Call your local or state health department.  Contact the Centers for Disease Control and Prevention (CDC): ? Call (540)164-9661 (1-800-CDC-INFO) or ? Visit CDC's website at https://gibson.com/ Vaccine Information Statement, Inactivated Influenza Vaccine (05/12/2014) This information is not intended to replace advice given to you by your health care provider. Make sure  you discuss any questions you have with your health care provider. Document Released: 07/17/2006 Document Revised: 06/12/2016 Document Reviewed: 06/12/2016 Elsevier Interactive Patient Education  2017 [employer]  Chronic Ankle Instability Rehab Ask your health care provider which exercises are safe for you. Do exercises exactly as told by your health care provider and adjust them as directed. It is normal to feel mild stretching, pulling, tightness, or discomfort as you do these exercises, but you should stop right away if you feel sudden pain or your pain gets worse.Do not begin these exercises until told by your health care provider. Strengthening exercises These exercises build strength and endurance in your ankle. Endurance is the ability to use your muscles for a long time, even  after they get tired. Exercise A: Eversion 1. Sit on the floor with your legs straight out in front of you. 2. Loop a rubber exercise band around the ball of your left / right foot. The ball of your foot is on the walking surface, right under your toes. Hold the ends of the band in your hands, or secure the band to a stable object. 3. Slowly push your foot outward, away from your other leg. 4. Hold this position for __________ seconds. 5. Slowly return your foot to the starting position. Repeat __________ times. Complete this exercise __________ times per day. Exercise B: Heel walking ( dorsiflexion) Walk on your heels for __________ . Keep your toes as high as possible. Repeat __________ times. Complete this exercise __________ times per day. Exercise C: Toe walking ( plantar flexion) Walk on your toes for __________. Keep your heels as high as possible. Repeat __________ times. Complete this exercise __________ times per day. Balance exercises These exercises improve or maintain your balance. Balance is important in improving ankle stability and preventing falls. Exercise D: Tandem walking Do this exercise in a hallway or room that is at least 10 ft. (3 m) long. 1. Stand with one foot directly in front of the other. You can use the walls to help you balance if needed, but try not to use them for support. 2. Slowly lift your back foot and place it directly in front of your other foot. 3. Continue to walk in this heel-to-toe way for __________. Repeat __________ times. Complete this exercise __________ times per day. Exercise E: Single leg stand 1. Without shoes, stand near a railing or in a doorway. You may hold onto the railing or door frame as needed for balance. 2. Stand on your left / right foot. Keep your big toe down on the floor and try to keep your arch lifted. If this is too easy, you can try doing it while you do one of these actions: ? Keep your eyes closed. ? Stand on a  pillow. ? Throw a ball against a wall. 3. Hold this position for __________ seconds. Repeat __________ times. Complete this exercise __________ times per day. Exercise F: Inversion/eversion 1  You will need a balance board for this exercise. Ask your health care provider where you can get a balance board or how you can make one. 1. Stand on a non-carpeted surface near a countertop or wall. 2. Step onto the balance board so your feet are hip-width apart. 3. Keep your feet in place and keep your upper body and hips steady. Using only your feet and ankles, tip the board from side to side as far as you can, alternating between tipping to the left and to the right. If you  can, tip the board so it silently taps the floor. Do not let the board forcefully hit the floor. Repeat __________ times, pausing from time to time to hold a steady position. Complete this exercise __________ times a day. Exercise G: Inversion/eversion 2 You will need a balance board for this exercise. Ask your health care provider where you can get a balance board or how you can make one. 1. Stand on a non-carpeted surface near a countertop or wall. 2. Step onto the balance board so your feet are hip-width apart. 3. Keep your feet in place and keep your upper body and hips steady. Using only your feet and ankles, tip the board from side to side, alternating between tipping to the left and to the right. Do not let the board hit the floor at all. Repeat __________ times, pausing from time to time to hold a steady position. Complete this exercise __________ times a day. Exercise H: Plantar flexion/dorsiflexion 1  You will need a balance board for this exercise. Ask your health care provider where you can get a balance board or how you can make one. 1. Stand on a non-carpeted surface near a countertop or wall. 2. Step onto the balance board so your feet are hip-width apart. 3. Keep your feet in place and keep your upper body and hips  steady. Using only your feet and ankles, tip the board forward and backward so the board silently taps the floor. Do not let the board forcefully hit the floor. Repeat __________ times, pausing from time to time to hold a steady position. Complete this exercise __________ times a day. Exercise I: Plantar flexion/dorsiflexion 2 You will need a balance board for this exercise. Ask your health care provider where you can get a balance board or how you can make one. 1. Stand on a non-carpeted surface near a countertop or wall. 2. Step onto the balance board so your feet are hip-width apart. 3. Keep your feet in place and keep your upper body and hips steady. Using only your feet and ankles, tip the board forward and back so the board does not hit the floor at all. Repeat __________ times, pausing from time to time to hold a steady position. Complete this exercise __________ times a day. This information is not intended to replace advice given to you by your health care provider. Make sure you discuss any questions you have with your health care provider. Document Released: 04/23/2005 Document Revised: 05/29/2016 Document Reviewed: 08/10/2015 Elsevier Interactive Patient Education  2018 [employer].  Health Maintenance, Male A healthy lifestyle and preventive care is important for your health and wellness. Ask your health care provider about what schedule of regular examinations is right for you. What should I know about weight and diet? Eat a Healthy Diet  Eat plenty of vegetables, fruits, whole grains, low-fat dairy products, and lean protein.  Do not eat a lot of foods high in solid fats, added sugars, or salt.  Maintain a Healthy Weight Regular exercise can help you achieve or maintain a healthy weight. You should:  Do at least 150 minutes of exercise each week. The exercise should increase your heart rate and make you sweat (moderate-intensity exercise).  Do strength-training exercises at  least twice a week.  Watch Your Levels of Cholesterol and Blood Lipids  Have your blood tested for lipids and cholesterol every 5 years starting at 26 years of age. If you are at high risk for heart disease, you should start  having your blood tested when you are 27 years old. You may need to have your cholesterol levels checked more often if: ? Your lipid or cholesterol levels are high. ? You are older than 26 years of age. ? You are at high risk for heart disease.  What should I know about cancer screening? Many types of cancers can be detected early and may often be prevented. Lung Cancer  You should be screened every year for lung cancer if: ? You are a current smoker who has smoked for at least 30 years. ? You are a former smoker who has quit within the past 15 years.  Talk to your health care provider about your screening options, when you should start screening, and how often you should be screened.  Colorectal Cancer  Routine colorectal cancer screening usually begins at 26 years of age and should be repeated every 5-10 years until you are 26 years old. You may need to be screened more often if early forms of precancerous polyps or small growths are found. Your health care provider may recommend screening at an earlier age if you have risk factors for colon cancer.  Your health care provider may recommend using home test kits to check for hidden blood in the stool.  A small camera at the end of a tube can be used to examine your colon (sigmoidoscopy or colonoscopy). This checks for the earliest forms of colorectal cancer.  Prostate and Testicular Cancer  Depending on your age and overall health, your health care provider may do certain tests to screen for prostate and testicular cancer.  Talk to your health care provider about any symptoms or concerns you have about testicular or prostate cancer.  Skin Cancer  Check your skin from head to toe regularly.  Tell your health  care provider about any new moles or changes in moles, especially if: ? There is a change in a mole's size, shape, or color. ? You have a mole that is larger than a pencil eraser.  Always use sunscreen. Apply sunscreen liberally and repeat throughout the day.  Protect yourself by wearing long sleeves, pants, a wide-brimmed hat, and sunglasses when outside.  What should I know about heart disease, diabetes, and high blood pressure?  If you are 35-31 years of age, have your blood pressure checked every 3-5 years. If you are 27 years of age or older, have your blood pressure checked every year. You should have your blood pressure measured twice-once when you are at a hospital or clinic, and once when you are not at a hospital or clinic. Record the average of the two measurements. To check your blood pressure when you are not at a hospital or clinic, you can use: ? An automated blood pressure machine at a pharmacy. ? A home blood pressure monitor.  Talk to your health care provider about your target blood pressure.  If you are between 29-37 years old, ask your health care provider if you should take aspirin to prevent heart disease.  Have regular diabetes screenings by checking your fasting blood sugar level. ? If you are at a normal weight and have a low risk for diabetes, have this test once every three years after the age of 74. ? If you are overweight and have a high risk for diabetes, consider being tested at a younger age or more often.  A one-time screening for abdominal aortic aneurysm (AAA) by ultrasound is recommended for men aged 65-75 years who are  current or former smokers. What should I know about preventing infection? Hepatitis B If you have a higher risk for hepatitis B, you should be screened for this virus. Talk with your health care provider to find out if you are at risk for hepatitis B infection. Hepatitis C Blood testing is recommended for:  Everyone born from [birth year]  through 1965.  Anyone with known risk factors for hepatitis C.  Sexually Transmitted Diseases (STDs)  You should be screened each year for STDs including gonorrhea and chlamydia if: ? You are sexually active and are younger than 26 years of age. ? You are older than 26 years of age and your health care provider tells you that you are at risk for this type of infection. ? Your sexual activity has changed since you were last screened and you are at an increased risk for chlamydia or gonorrhea. Ask your health care provider if you are at risk.  Talk with your health care provider about whether you are at high risk of being infected with HIV. Your health care provider may recommend a prescription medicine to help prevent HIV infection.  What else can I do?  Schedule regular health, dental, and eye exams.  Stay current with your vaccines (immunizations).  Do not use any tobacco products, such as cigarettes, chewing tobacco, and e-cigarettes. If you need help quitting, ask your health care provider.  Limit alcohol intake to no more than 2 drinks per day. One drink equals 12 ounces of beer, 5 ounces of wine, or 1 ounces of hard liquor.  Do not use street drugs.  Do not share needles.  Ask your health care provider for help if you need support or information about quitting drugs.  Tell your health care provider if you often feel depressed.  Tell your health care provider if you have ever been abused or do not feel safe at home. This information is not intended to replace advice given to you by your health care provider. Make sure you discuss any questions you have with your health care provider. Document Released: 03/20/2008 Document Revised: 05/21/2016 Document Reviewed: 06/26/2015 Elsevier Interactive Patient Education  [employer].

## 2017-06-11 NOTE — Assessment & Plan Note (Signed)
Better on recheck. Continue current regimen. Continue to monitor. Call with any concerns.  

## 2017-06-11 NOTE — Progress Notes (Signed)
BP 124/62   Pulse 88   Temp 98.6 F (37 C)   Ht 5\' 11"  (1.803 m)   Wt 251 lb 5 oz (114 kg)   SpO2 99%   BMI 35.05 kg/m    Subjective:    Patient ID: Daniel Martinez, male    DOB: 09/25/1991, 26 y.o.   MRN: [id number]  HPI: Daniel Martinez is a 26 y.o. male presenting on 06/11/2017 for comprehensive medical examination. Current medical complaints include:  HYPERTENSION Hypertension status: stable  Satisfied with current treatment? yes Duration of hypertension: chronic BP monitoring frequency:  not checking BP medication side effects:  no Medication compliance: excellent compliance Previous BP meds: lisinopril Aspirin: no Recurrent headaches: no Visual changes: no Palpitations: no Dyspnea: no Chest pain: no Lower extremity edema: no Dizzy/lightheaded: no  Interim Problems from his last visit: no  Depression Screen done today and results listed below:  [facility]  Decreased Interest 0  Down, Depressed, Hopeless 0  PHQ - 2 Score 0  Altered sleeping 1  Tired, decreased energy 1  Change in appetite 0  Feeling bad or failure about yourself  0  Trouble concentrating 0  Moving slowly or fidgety/restless 0  Suicidal thoughts 0  PHQ-9 Score 2    Past Medical History:  History reviewed. No pertinent past medical history.  Surgical History:  History reviewed. No pertinent surgical history.  Medications:  No current outpatient prescriptions on file prior to visit.   No current facility-administered medications on file prior to visit.     Allergies:  No Known Allergies  Social History:  Social History   Social History  . Marital status: Single    Spouse name: N/A  . Number of children: N/A  . Years of education: N/A   Occupational History  . Not on file.   Social History Main Topics  . Smoking status: Never Smoker  . Smokeless tobacco: Never Used  . Alcohol use No  . Drug use: No  . Sexual activity: Not Currently    Other Topics Concern  . Not on file   Social History Narrative  . No narrative on file   History  Smoking Status  . Never Smoker  Smokeless Tobacco  . Never Used   History  Alcohol Use No    Family History:  Family History  Problem Relation Age of Onset  . Diabetes Mother     Past medical history, surgical history, medications, allergies, family history and social history reviewed with patient today and changes made to appropriate areas of the chart.   Review of Systems  Constitutional: Negative.   HENT: Negative.   Eyes: Negative.   Respiratory: Negative.   Cardiovascular: Negative.   Gastrointestinal: Positive for abdominal pain. Negative for blood in stool, constipation, diarrhea, heartburn, melena, nausea and vomiting.  Genitourinary: Negative.   Musculoskeletal: Negative.   Skin: Positive for rash. Negative for itching.  Neurological: Negative.   Endo/Heme/Allergies: Positive for polydipsia. Negative for environmental allergies. Does not bruise/bleed easily.  Psychiatric/Behavioral: Negative.    All other ROS negative except what is listed above and in the HPI.      Objective:    BP 124/62   Pulse 88   Temp 98.6 F (37 C)   Ht 5\' 11"  (1.803 m)   Wt 251 lb 5 oz (114 kg)   SpO2 99%   BMI 35.05 kg/m   Wt Readings from Last 3 Encounters:  06/11/17 251 lb 5 oz (  114 kg)  02/17/17 250 lb 4.8 oz (113.5 kg)  01/27/17 249 lb 4.8 oz (113.1 kg)    Physical Exam  Constitutional: He is oriented to person, place, and time. He appears well-developed and well-nourished. No distress.  HENT:  Head: Normocephalic and atraumatic.  Right Ear: Hearing, tympanic membrane, external ear and ear canal normal.  Left Ear: Hearing, tympanic membrane, external ear and ear canal normal.  Nose: Nose normal.  Mouth/Throat: Uvula is midline, oropharynx is clear and moist and mucous membranes are normal. No oropharyngeal exudate.  Eyes: Pupils are equal, round, and reactive to  light. Conjunctivae, EOM and lids are normal. Right eye exhibits no discharge. Left eye exhibits no discharge. No scleral icterus.  Neck: Normal range of motion. Neck supple. No JVD present. No tracheal deviation present. No thyromegaly present.  Cardiovascular: Normal rate, regular rhythm, normal heart sounds and intact distal pulses.  Exam reveals no gallop and no friction rub.   No murmur heard. Pulmonary/Chest: Effort normal and breath sounds normal. No stridor. No respiratory distress. He has no wheezes. He has no rales. He exhibits no tenderness.  Abdominal: Soft. Bowel sounds are normal. He exhibits no distension and no mass. There is no tenderness. There is no rebound and no guarding. Hernia confirmed negative in the right inguinal area and confirmed negative in the left inguinal area.  Genitourinary: Testes normal and penis normal. Cremasteric reflex is present. Right testis shows no mass, no swelling and no tenderness. Right testis is descended. Cremasteric reflex is not absent on the right side. Left testis shows no mass, no swelling and no tenderness. Left testis is descended. Cremasteric reflex is not absent on the left side. No phimosis, paraphimosis, hypospadias, penile erythema or penile tenderness. No discharge found.  Musculoskeletal: Normal range of motion. He exhibits no edema, tenderness or deformity.  Lymphadenopathy:    He has no cervical adenopathy.  Neurological: He is alert and oriented to person, place, and time. He displays normal reflexes. No cranial nerve deficit. He exhibits normal muscle tone. Coordination normal.  Skin: Skin is warm, dry and intact. Rash (eczema on hands bilaterally) noted. He is not diaphoretic. No erythema. No pallor.  Red spot on the head on his penis, not hurting, not bleeding, just noticed it- non-vesicular  Psychiatric: He has a normal mood and affect. His speech is normal and behavior is normal. Judgment and thought content normal. Cognition and  memory are normal.  Nursing note and vitals reviewed.   Results for orders placed or performed in visit on 01/27/17  Rapid strep screen (not at [facility])  Result Value Ref Range   Strep Gp A Ag, IA W/Reflex Negative Negative  Culture, Group A Strep  Result Value Ref Range   Strep A Culture Negative       Assessment & Plan:   Problem List Items Addressed This Visit      Cardiovascular and Mediastinum   Essential hypertension    Better on recheck. Continue current regimen. Continue to monitor. Call with any concerns.       Relevant Medications   lisinopril (PRINIVIL,ZESTRIL) 20 MG tablet   Other Relevant Orders   Microalbumin, Urine Waived    Other Visit Diagnoses    Routine general medical examination at a health care facility    -  Primary   Vaccines up to date. Screening labs checked today. Continue diet and exercise. Call with any concerns.    Relevant Orders   CBC with Differential/Platelet   Comprehensive  metabolic panel   Lipid Panel w/o Chol/HDL Ratio   TSH   UA/M w/rflx Culture, Routine   Immunization due       Flu shot given today.   Relevant Orders   Flu Vaccine QUAD 6+ mos PF IM (Fluarix Quad PF) (Completed)   Eczema, unspecified type       Will treat with triamcinalone. Call if not getting better or getting worse.    Routine screening for STI (sexually transmitted infection)       Labs drawn today. Await results.    Relevant Orders   HIV antibody   HSV(herpes simplex vrs) 1+2 ab-IgG   RPR   Hepatitis, Acute   GC/Chlamydia Probe Amp       LABORATORY TESTING:  Health maintenance labs ordered today as discussed above.   IMMUNIZATIONS:   - Tdap: Tetanus vaccination status reviewed: last tetanus booster within 10 years. - Influenza: Administered today - Pneumovax: Not applicable  PATIENT COUNSELING:    Sexuality: Discussed sexually transmitted diseases, partner selection, use of condoms, avoidance of unintended pregnancy  and contraceptive  alternatives.   Advised to avoid cigarette smoking.  I discussed with the patient that most people either abstain from alcohol or drink within safe limits (<=14/week and <=4 drinks/occasion for males, <=7/weeks and <= 3 drinks/occasion for females) and that the risk for alcohol disorders and other health effects rises proportionally with the number of drinks per week and how often a drinker exceeds daily limits.  Discussed cessation/primary prevention of drug use and availability of treatment for abuse.   Diet: Encouraged to adjust caloric intake to maintain  or achieve ideal body weight, to reduce intake of dietary saturated fat and total fat, to limit sodium intake by avoiding high sodium foods and not adding table salt, and to maintain adequate dietary potassium and calcium preferably from fresh fruits, vegetables, and low-fat dairy products.    stressed the importance of regular exercise  Injury prevention: Discussed safety belts, safety helmets, smoke detector, smoking near bedding or upholstery.   Dental health: Discussed importance of regular tooth brushing, flossing, and dental visits.   Follow up plan: NEXT PREVENTATIVE PHYSICAL DUE IN 1 YEAR. Return in about 6 months (around 12/09/2017) for BP follow up.

## 2017-06-12 ENCOUNTER — Telehealth: Payer: Self-pay | Admitting: Family Medicine

## 2017-06-12 LAB — COMPREHENSIVE METABOLIC PANEL
ALT: 45 IU/L — AB (ref 0–44)
AST: 24 IU/L (ref 0–40)
Albumin/Globulin Ratio: 1.8 (ref 1.2–2.2)
Albumin: 4.8 g/dL (ref 3.5–5.5)
Alkaline Phosphatase: 84 IU/L (ref 39–117)
BILIRUBIN TOTAL: 0.3 mg/dL (ref 0.0–1.2)
BUN/Creatinine Ratio: 14 (ref 9–20)
BUN: 12 mg/dL (ref 6–20)
CHLORIDE: 100 mmol/L (ref 96–106)
CO2: 22 mmol/L (ref 20–29)
Calcium: 9.5 mg/dL (ref 8.7–10.2)
Creatinine, Ser: 0.86 mg/dL (ref 0.76–1.27)
GFR calc non Af Amer: 120 mL/min/{1.73_m2} (ref >59)
GFR, EST AFRICAN AMERICAN: 138 mL/min/{1.73_m2} (ref >59)
Globulin, Total: 2.6 g/dL (ref 1.5–4.5)
Glucose: 99 mg/dL (ref 65–99)
POTASSIUM: 4 mmol/L (ref 3.5–5.2)
Sodium: 138 mmol/L (ref 134–144)
TOTAL PROTEIN: 7.4 g/dL (ref 6.0–8.5)

## 2017-06-12 LAB — LIPID PANEL W/O CHOL/HDL RATIO
Cholesterol, Total: 148 mg/dL (ref 100–199)
HDL: 45 mg/dL (ref >39)
LDL Calculated: 73 mg/dL (ref 0–99)
Triglycerides: 150 mg/dL — ABNORMAL HIGH (ref 0–149)
VLDL Cholesterol Cal: 30 mg/dL (ref 5–40)

## 2017-06-12 LAB — CBC WITH DIFFERENTIAL/PLATELET
BASOS: 0 %
Basophils Absolute: 0 10*3/uL (ref 0.0–0.2)
EOS (ABSOLUTE): 0.3 10*3/uL (ref 0.0–0.4)
Eos: 3 %
Hematocrit: 45.6 % (ref 37.5–51.0)
Hemoglobin: 15.2 g/dL (ref 13.0–17.7)
IMMATURE GRANS (ABS): 0 10*3/uL (ref 0.0–0.1)
Immature Granulocytes: 0 %
LYMPHS: 17 %
Lymphocytes Absolute: 1.7 10*3/uL (ref 0.7–3.1)
MCH: 28.5 pg (ref 26.6–33.0)
MCHC: 33.3 g/dL (ref 31.5–35.7)
MCV: 85 fL (ref 79–97)
MONOS ABS: 0.4 10*3/uL (ref 0.1–0.9)
Monocytes: 5 %
NEUTROS ABS: 7.3 10*3/uL — AB (ref 1.4–7.0)
Neutrophils: 75 %
PLATELETS: 286 10*3/uL (ref 150–379)
RBC: 5.34 x10E6/uL (ref 4.14–5.80)
RDW: 14.1 % (ref 12.3–15.4)
WBC: 9.7 10*3/uL (ref 3.4–10.8)

## 2017-06-12 LAB — RPR: RPR: NONREACTIVE

## 2017-06-12 LAB — TSH: TSH: 1.4 u[IU]/mL (ref 0.450–4.500)

## 2017-06-12 LAB — HEPATITIS PANEL, ACUTE
HEP A IGM: NEGATIVE
HEP B C IGM: NEGATIVE
Hepatitis B Surface Ag: NEGATIVE

## 2017-06-12 LAB — GC/CHLAMYDIA PROBE AMP
Chlamydia trachomatis, NAA: NEGATIVE
Neisseria gonorrhoeae by PCR: NEGATIVE

## 2017-06-12 LAB — HSV(HERPES SIMPLEX VRS) I + II AB-IGG
HSV 1 Glycoprotein G Ab, IgG: <0.91 index (ref 0.00–0.90)
HSV 2 IgG, Type Spec: <0.91 index (ref 0.00–0.90)

## 2017-06-12 LAB — HIV ANTIBODY (ROUTINE TESTING W REFLEX): HIV Screen 4th Generation wRfx: NONREACTIVE

## 2017-06-12 NOTE — Telephone Encounter (Signed)
Patient notified

## 2017-06-12 NOTE — Telephone Encounter (Signed)
Please let him know that all his labs came back normal. Thanks! 

## 2017-06-12 NOTE — Telephone Encounter (Signed)
Please let him know that everything looks normal so far and we should get the rest of his results back on Monday. Thanks!

## 2017-06-12 NOTE — Telephone Encounter (Signed)
Left message on machine for pt to return call to the office.  

## 2017-12-09 ENCOUNTER — Ambulatory Visit (INDEPENDENT_AMBULATORY_CARE_PROVIDER_SITE_OTHER): Payer: BLUE CROSS/BLUE SHIELD | Admitting: Family Medicine

## 2017-12-09 ENCOUNTER — Encounter: Payer: Self-pay | Admitting: Family Medicine

## 2017-12-09 VITALS — BP 131/77 | HR 59 | Temp 98.6°F | Wt 228.4 lb

## 2017-12-09 DIAGNOSIS — I10 Essential (primary) hypertension: Secondary | ICD-10-CM | POA: Diagnosis not present

## 2017-12-09 MED ORDER — LISINOPRIL 20 MG PO TABS: 20.0000 mg | ORAL_TABLET | Freq: Every day | ORAL | 1 refills | Status: DC

## 2017-12-09 NOTE — Progress Notes (Signed)
BP 131/77 (BP Location: Left Arm, Patient Position: Sitting, Cuff Size: Normal)   Pulse (!) 59   Temp 98.6 F (37 C)   Wt 228 lb 7 oz (103.6 kg)   SpO2 99%   BMI 31.86 kg/m    Subjective:    Patient ID: Daniel Martinez, male    DOB: 1991-04-27, 27 y.o.   MRN: [id number]  HPI: [name] is a 27 y.o. male  Chief Complaint  Patient presents with  . Hypertension   HYPERTENSION Hypertension status: controlled  Satisfied with current treatment? yes Duration of hypertension: chronic BP monitoring frequency:  not checking BP medication side effects:  no Medication compliance: excellent compliance Previous BP meds: lisinopril Aspirin: no Recurrent headaches: no Visual changes: no Palpitations: no Dyspnea: no Chest pain: no Lower extremity edema: no Dizzy/lightheaded: no  Relevant past medical, surgical, family and social history reviewed and updated as indicated. Interim medical history since our last visit reviewed. Allergies and medications reviewed and updated.  Review of Systems  Constitutional: Negative.   Respiratory: Negative.   Cardiovascular: Negative.   Psychiatric/Behavioral: Negative.     Per HPI unless specifically indicated above     Objective:    BP 131/77 (BP Location: Left Arm, Patient Position: Sitting, Cuff Size: Normal)   Pulse (!) 59   Temp 98.6 F (37 C)   Wt 228 lb 7 oz (103.6 kg)   SpO2 99%   BMI 31.86 kg/m   Wt Readings from Last 3 Encounters:  12/09/17 228 lb 7 oz (103.6 kg)  06/11/17 251 lb 5 oz (114 kg)  02/17/17 250 lb 4.8 oz (113.5 kg)    Physical Exam  Constitutional: He is oriented to person, place, and time. He appears well-developed and well-nourished. No distress.  HENT:  Head: Normocephalic and atraumatic.  Right Ear: Hearing normal.  Left Ear: Hearing normal.  Nose: Nose normal.  Eyes: Conjunctivae and lids are normal. Right eye exhibits no discharge. Left eye exhibits no discharge. No scleral icterus.    Cardiovascular: Normal rate, regular rhythm, normal heart sounds and intact distal pulses. Exam reveals no gallop and no friction rub.  No murmur heard. Pulmonary/Chest: Effort normal and breath sounds normal. No respiratory distress. He has no wheezes. He has no rales. He exhibits no tenderness.  Musculoskeletal: Normal range of motion.  Neurological: He is alert and oriented to person, place, and time.  Skin: Skin is warm and intact. No rash noted. He is not diaphoretic. No erythema. No pallor.  Psychiatric: He has a normal mood and affect. His speech is normal and behavior is normal. Judgment and thought content normal. Cognition and memory are normal.  Nursing note and vitals reviewed.   Results for orders placed or performed in visit on 06/11/17  GC/Chlamydia Probe Amp  Result Value Ref Range   Chlamydia trachomatis, NAA Negative Negative   Neisseria gonorrhoeae by PCR Negative Negative  Microscopic Examination  Result Value Ref Range   WBC, UA 0-5 0 - 5 /hpf   RBC, UA None seen 0 - 2 /hpf   Epithelial Cells (non renal) CANCELED    Bacteria, UA None seen None seen/Few  CBC with Differential/Platelet  Result Value Ref Range   WBC 9.7 3.4 - 10.8 x10E3/uL   RBC 5.34 4.14 - 5.80 x10E6/uL   Hemoglobin 15.2 13.0 - 17.7 g/dL   Hematocrit 40.9 81.1 - 51.0 %   MCV 85 79 - 97 fL   MCH 28.5 26.6 - 33.0 pg  MCHC 33.3 31.5 - 35.7 g/dL   RDW 40.9 81.1 - 91.4 %   Platelets 286 150 - 379 x10E3/uL   Neutrophils 75 Not Estab. %   Lymphs 17 Not Estab. %   Monocytes 5 Not Estab. %   Eos 3 Not Estab. %   Basos 0 Not Estab. %   Neutrophils Absolute 7.3 (H) 1.4 - 7.0 x10E3/uL   Lymphocytes Absolute 1.7 0.7 - 3.1 x10E3/uL   Monocytes Absolute 0.4 0.1 - 0.9 x10E3/uL   EOS (ABSOLUTE) 0.3 0.0 - 0.4 x10E3/uL   Basophils Absolute 0.0 0.0 - 0.2 x10E3/uL   Immature Granulocytes 0 Not Estab. %   Immature Grans (Abs) 0.0 0.0 - 0.1 x10E3/uL  Comprehensive metabolic panel  Result Value Ref Range    Glucose 99 65 - 99 mg/dL   BUN 12 6 - 20 mg/dL   Creatinine, Ser 7.82 0.76 - 1.27 mg/dL   GFR calc non Af Amer 120 >59 mL/min/1.73   GFR calc Af Amer 138 >59 mL/min/1.73   BUN/Creatinine Ratio 14 9 - 20   Sodium 138 134 - 144 mmol/L   Potassium 4.0 3.5 - 5.2 mmol/L   Chloride 100 96 - 106 mmol/L   CO2 22 20 - 29 mmol/L   Calcium 9.5 8.7 - 10.2 mg/dL   Total Protein 7.4 6.0 - 8.5 g/dL   Albumin 4.8 3.5 - 5.5 g/dL   Globulin, Total 2.6 1.5 - 4.5 g/dL   Albumin/Globulin Ratio 1.8 1.2 - 2.2   Bilirubin Total 0.3 0.0 - 1.2 mg/dL   Alkaline Phosphatase 84 39 - 117 IU/L   AST 24 0 - 40 IU/L   ALT 45 (H) 0 - 44 IU/L  Lipid Panel w/o Chol/HDL Ratio  Result Value Ref Range   Cholesterol, Total 148 100 - 199 mg/dL   Triglycerides 956 (H) 0 - 149 mg/dL   HDL 45 >21 mg/dL   VLDL Cholesterol Cal 30 5 - 40 mg/dL   LDL Calculated 73 0 - 99 mg/dL  Microalbumin, Urine Waived  Result Value Ref Range   Microalb, Ur Waived 10 0 - 19 mg/L   Creatinine, Urine Waived 200 10 - 300 mg/dL   Microalb/Creat Ratio <30 <30 mg/g  TSH  Result Value Ref Range   TSH 1.400 0.450 - 4.500 uIU/mL  UA/M w/rflx Culture, Routine  Result Value Ref Range   Specific Gravity, UA 1.020 1.005 - 1.030   pH, UA 6.0 5.0 - 7.5   Color, UA Yellow Yellow   Appearance Ur Clear Clear   Leukocytes, UA Negative Negative   Protein, UA Negative Negative/Trace   Glucose, UA Negative Negative   Ketones, UA Negative Negative   RBC, UA Negative Negative   Bilirubin, UA Negative Negative   Urobilinogen, Ur 0.2 0.2 - 1.0 mg/dL   Nitrite, UA Negative Negative   Microscopic Examination See below:   HIV antibody  Result Value Ref Range   HIV Screen 4th Generation wRfx Non Reactive Non Reactive  HSV(herpes simplex vrs) 1+2 ab-IgG  Result Value Ref Range   HSV 1 Glycoprotein G Ab, IgG <0.91 0.00 - 0.90 index   HSV 2 IgG, Type Spec <0.91 0.00 - 0.90 index  RPR  Result Value Ref Range   RPR Ser Ql Non Reactive Non Reactive    Hepatitis, Acute  Result Value Ref Range   Hep A IgM Negative Negative   Hepatitis B Surface Ag Negative Negative   Hep B C IgM Negative Negative  Hep C Virus Ab <0.1 0.0 - 0.9 s/co ratio      Assessment & Plan:   Problem List Items Addressed This Visit      Cardiovascular and Mediastinum   Essential hypertension - Primary    Under good control. Continue current regimen. Continue to monitor. Call with any concerns. Refills given today.      Relevant Medications   lisinopril (PRINIVIL,ZESTRIL) 20 MG tablet   Other Relevant Orders   Basic metabolic panel       Follow up plan: Return in about 6 months (around 06/11/2018) for Physical.

## 2017-12-09 NOTE — Assessment & Plan Note (Addendum)
Under good control. Continue current regimen. Continue to monitor. Call with any concerns. Refills given today. 

## 2017-12-09 NOTE — Patient Instructions (Addendum)
[facility] Stretch Marks Stretch marks (striae) are streaks or lines that appear on your skin. Stretch marks usually appear on your belly, buttocks, breasts, and thighs. Women get stretch marks more often than men. You may be at higher risk for stretch marks if you have a family history of them. Stretch marks are most common in:  Pregnant women.  People who gain or lose weight quickly.  Boys and girls going through puberty.  People who use steroid creams or take steroid drugs.  Bodybuilders who build muscles too quickly.  When stretch marks first appear, they may look like flat, wrinkly skin that is stretched out. They may be pink and itchy. Over time, they may become longer and turn red or purple, and old stretch marks lose their color and look white or silvery. Stretch marks are not dangerous for your health. What causes stretch marks? Stretch marks develop when your skin is stretched too quickly. The stretching causes a tear in the middle layer of skin. The tear lets the deeper layer of skin show through. Blood vessels in the deep layer may make stretch marks look pink, red, or purple. This may occur during:  Pregnancy due to: ? Weight gain and the increasing size of your belly. ? The hormones produced during pregnancy that allow your tissues to stretch.  Puberty. ? Boys may get stretch marks on their back and shoulders. ? Girls may get stretch marks on their breasts, hips, and thighs.  A less common cause of stretch marks is a disorder called Cushing syndrome. If you have Cushing syndrome, your body produces too much of the hormone cortisol, which can cause stretch marks. Stretch marks from Cushing syndrome tend to be wider and larger than other kinds and may also appear on your face. Tell your health care provider if you have these kinds of stretch marks. Can I make them go away? Stretch marks may fade or disappear over time. There are a few treatment options, but there is not much  evidence that any treatment works very well. Treatment may include:  Using skin moisturizers.  Using tretinoin cream. ? This cream is a form of vitamin A. It may improve stretch marks in the early stages. ? You will need a prescription from your health care provider and will not be able to use this treatment if you are pregnant, may become pregnant, or are breastfeeding.  Having laser treatment. This may help fade early stretch marks by shrinking the blood vessels in the deep layers of your skin.  Is there anything I can do to prevent stretch marks? You may not be able to prevent stretch marks, but you can take steps to reduce your risk, including:  Maintaining a healthy weight and avoiding losing or gaining weight quickly.  Losing no more than one pound a week if you diet, as you may gain the weight back just as quickly.  Eating a healthy and balanced diet with enough vitamins and minerals to keep your skin healthy.  Working with your health care provider to make sure your weight gain is in a healthy range if you are pregnant.  Avoiding bulking up too quickly if you are exercising to build muscle.  Taking steroids and using steroid creams only as directed by your health care provider.  This information is not intended to replace advice given to you by your health care provider. Make sure you discuss any questions you have with your health care provider. Document Released: 04/19/2014 Document Revised: 05/21/2016  Document Reviewed: 11/28/2013 Elsevier Interactive Patient Education  [employer]2018 [employer]

## 2017-12-10 ENCOUNTER — Encounter: Payer: Self-pay | Admitting: Family Medicine

## 2017-12-10 LAB — BASIC METABOLIC PANEL
BUN/Creatinine Ratio: 16 (ref 9–20)
BUN: 14 mg/dL (ref 6–20)
CALCIUM: 9.9 mg/dL (ref 8.7–10.2)
CHLORIDE: 102 mmol/L (ref 96–106)
CO2: 24 mmol/L (ref 20–29)
CREATININE: 0.88 mg/dL (ref 0.76–1.27)
GFR, EST AFRICAN AMERICAN: 137 mL/min/{1.73_m2} (ref >59)
GFR, EST NON AFRICAN AMERICAN: 119 mL/min/{1.73_m2} (ref >59)
Glucose: 87 mg/dL (ref 65–99)
Potassium: 4.6 mmol/L (ref 3.5–5.2)
Sodium: 142 mmol/L (ref 134–144)

## 2018-02-16 ENCOUNTER — Encounter: Payer: Self-pay | Admitting: Family Medicine

## 2018-06-13 NOTE — Progress Notes (Signed)
BP 132/72 (BP Location: Left Arm, Cuff Size: Normal)   Pulse 75   Temp 98.3 F (36.8 C)   Ht 5' 10.5" (1.791 m)   Wt 216 lb 7 oz (98.2 kg)   SpO2 100%   BMI 30.62 kg/m    Subjective:    Patient ID: Daniel Martinez, male    DOB: 29-May-1991, 27 y.o.   MRN: [id number]  HPI: Daniel Martinez is a 27 y.o. male presenting on 06/14/2018 for comprehensive medical examination. Current medical complaints include:  Has been having some shaking in his hands. It happens at random. Seems to be when he's more dehydrated.   HYPERTENSION Hypertension status: stable  Satisfied with current treatment? yes Duration of hypertension: chronic BP monitoring frequency:  not checking BP medication side effects:  no Medication compliance: excellent compliance Previous BP meds: lisinopril Aspirin: no Recurrent headaches: no Visual changes: no Palpitations: no Dyspnea: no Chest pain: no Lower extremity edema: no Dizzy/lightheaded: no  Interim Problems from his last visit: no  Depression Screen done today and results listed below:  Depression screen [facility] 2/9 12/09/2017 06/13/2016  Decreased Interest 3 0  Down, Depressed, Hopeless 3 0  PHQ - 2 Score 6 0  Altered sleeping 2 1  Tired, decreased energy 1 1  Change in appetite 0 0  Feeling bad or failure about yourself  0 0  Trouble concentrating 0 0  Moving slowly or fidgety/restless 0 0  Suicidal thoughts 0 0  PHQ-9 Score 9 2  Difficult doing work/chores Somewhat difficult -   Past Medical History:  History reviewed. No pertinent past medical history.  Surgical History:  History reviewed. No pertinent surgical history.  Medications:  No current outpatient medications on file prior to visit.   No current facility-administered medications on file prior to visit.     Allergies:  No Known Allergies  Social History:  Social History   Socioeconomic History  . Marital status: Single    Spouse name: Not on file  . Number of children:  Not on file  . Years of education: Not on file  . Highest education level: Not on file  Occupational History  . Not on file  Social Needs  . Financial resource strain: Not on file  . Food insecurity:    Worry: Not on file    Inability: Not on file  . Transportation needs:    Medical: Not on file    Non-medical: Not on file  Tobacco Use  . Smoking status: Never Smoker  . Smokeless tobacco: Never Used  Substance and Sexual Activity  . Alcohol use: No  . Drug use: No  . Sexual activity: Not Currently  Lifestyle  . Physical activity:    Days per week: Not on file    Minutes per session: Not on file  . Stress: Not on file  Relationships  . Social connections:    Talks on phone: Not on file    Gets together: Not on file    Attends religious service: Not on file    Active member of club or organization: Not on file    Attends meetings of clubs or organizations: Not on file    Relationship status: Not on file  . Intimate partner violence:    Fear of current or ex partner: Not on file    Emotionally abused: Not on file    Physically abused: Not on file    Forced sexual activity: Not on file  Other Topics Concern  .  Not on file  Social History Narrative  . Not on file   Social History   Tobacco Use  Smoking Status Never Smoker  Smokeless Tobacco Never Used   Social History   Substance and Sexual Activity  Alcohol Use No    Family History:  Family History  Problem Relation Age of Onset  . Diabetes Mother     Past medical history, surgical history, medications, allergies, family history and social history reviewed with patient today and changes made to appropriate areas of the chart.   Review of Systems  Constitutional: Negative.   HENT: Negative.   Eyes: Negative.   Respiratory: Negative.   Cardiovascular: Negative.   Gastrointestinal: Positive for heartburn. Negative for abdominal pain, blood in stool, constipation, diarrhea, melena, nausea and vomiting.    Genitourinary: Negative.   Musculoskeletal: Negative.   Skin: Negative.   Neurological: Positive for dizziness and tremors. Negative for tingling, sensory change, speech change, focal weakness, seizures, loss of consciousness, weakness and headaches.  Endo/Heme/Allergies: Negative.   Psychiatric/Behavioral: Negative.     All other ROS negative except what is listed above and in the HPI.      Objective:    BP 132/72 (BP Location: Left Arm, Cuff Size: Normal)   Pulse 75   Temp 98.3 F (36.8 C)   Ht 5' 10.5" (1.791 m)   Wt 216 lb 7 oz (98.2 kg)   SpO2 100%   BMI 30.62 kg/m   Wt Readings from Last 3 Encounters:  06/14/18 216 lb 7 oz (98.2 kg)  12/09/17 228 lb 7 oz (103.6 kg)  06/11/17 251 lb 5 oz (114 kg)    Physical Exam  Constitutional: He is oriented to person, place, and time. He appears well-developed and well-nourished. No distress.  HENT:  Head: Normocephalic and atraumatic.  Right Ear: Hearing, tympanic membrane, external ear and ear canal normal.  Left Ear: Hearing, tympanic membrane, external ear and ear canal normal.  Nose: Nose normal.  Mouth/Throat: Uvula is midline, oropharynx is clear and moist and mucous membranes are normal. No oropharyngeal exudate.  Eyes: Pupils are equal, round, and reactive to light. Conjunctivae, EOM and lids are normal. Right eye exhibits no discharge. Left eye exhibits no discharge. No scleral icterus.  Neck: Normal range of motion. Neck supple. No JVD present. No tracheal deviation present. No thyromegaly present.  Cardiovascular: Normal rate, regular rhythm, normal heart sounds and intact distal pulses. Exam reveals no gallop and no friction rub.  No murmur heard. Pulmonary/Chest: Effort normal and breath sounds normal. No stridor. No respiratory distress. He has no wheezes. He has no rales. He exhibits no tenderness.  Abdominal: Soft. Bowel sounds are normal. He exhibits no distension and no mass. There is no tenderness. There is no  rebound and no guarding. No hernia. Hernia confirmed negative in the right inguinal area and confirmed negative in the left inguinal area.  Genitourinary: Testes normal and penis normal. Cremasteric reflex is present. Right testis shows no mass, no swelling and no tenderness. Right testis is descended. Cremasteric reflex is not absent on the right side. Left testis shows no mass, no swelling and no tenderness. Left testis is descended. Cremasteric reflex is not absent on the left side. Circumcised. No phimosis, paraphimosis, hypospadias, penile erythema or penile tenderness. No discharge found.  Musculoskeletal: Normal range of motion. He exhibits no edema, tenderness or deformity.  Lymphadenopathy:    He has no cervical adenopathy.  Neurological: He is alert and oriented to person, place, and  time. He displays normal reflexes. No cranial nerve deficit or sensory deficit. He exhibits normal muscle tone. Coordination normal.  Skin: Skin is warm, dry and intact. Capillary refill takes less than 2 seconds. No rash noted. He is not diaphoretic. No erythema. No pallor.  Psychiatric: He has a normal mood and affect. His speech is normal and behavior is normal. Judgment and thought content normal. Cognition and memory are normal.  Nursing note and vitals reviewed.   Results for orders placed or performed in visit on 12/09/17  Basic metabolic panel  Result Value Ref Range   Glucose 87 65 - 99 mg/dL   BUN 14 6 - 20 mg/dL   Creatinine, Ser 4.65 0.76 - 1.27 mg/dL   GFR calc non Af Amer 119 >59 mL/min/1.73   GFR calc Af Amer 137 >59 mL/min/1.73   BUN/Creatinine Ratio 16 9 - 20   Sodium 142 134 - 144 mmol/L   Potassium 4.6 3.5 - 5.2 mmol/L   Chloride 102 96 - 106 mmol/L   CO2 24 20 - 29 mmol/L   Calcium 9.9 8.7 - 10.2 mg/dL      Assessment & Plan:   Problem List Items Addressed This Visit      Cardiovascular and Mediastinum   Essential hypertension    Under good control on recheck. Refills given.  Continue to monitor. Call with any concerns.       Relevant Medications   lisinopril (PRINIVIL,ZESTRIL) 20 MG tablet   Other Relevant Orders   Comprehensive metabolic panel   Microalbumin, Urine Waived    Other Visit Diagnoses    Routine general medical examination at a health care facility    -  Primary   Vaccines up to date. Screening labs checked today. Continue diet and exercise. Call with any concerns.   Relevant Orders   CBC with Differential/Platelet   Comprehensive metabolic panel   Lipid Panel w/o Chol/HDL Ratio   TSH   UA/M w/rflx Culture, Routine   Immunization due       Flu shot given today.   Relevant Orders   Flu Vaccine QUAD 6+ mos PF IM (Fluarix Quad PF) (Completed)   Tremor       Checking labs. Await results. Call with any concerns.    Relevant Orders   Bayer DCA Hb A1c Waived       LABORATORY TESTING:  Health maintenance labs ordered today as discussed above.   IMMUNIZATIONS:   - Tdap: Tetanus vaccination status reviewed: last tetanus booster within 10 years. - Influenza: Administered today  PATIENT COUNSELING:    Sexuality: Discussed sexually transmitted diseases, partner selection, use of condoms, avoidance of unintended pregnancy  and contraceptive alternatives.   Advised to avoid cigarette smoking.  I discussed with the patient that most people either abstain from alcohol or drink within safe limits (<=14/week and <=4 drinks/occasion for males, <=7/weeks and <= 3 drinks/occasion for females) and that the risk for alcohol disorders and other health effects rises proportionally with the number of drinks per week and how often a drinker exceeds daily limits.  Discussed cessation/primary prevention of drug use and availability of treatment for abuse.   Diet: Encouraged to adjust caloric intake to maintain  or achieve ideal body weight, to reduce intake of dietary saturated fat and total fat, to limit sodium intake by avoiding high sodium foods and not  adding table salt, and to maintain adequate dietary potassium and calcium preferably from fresh fruits, vegetables, and low-fat dairy products.  stressed the importance of regular exercise  Injury prevention: Discussed safety belts, safety helmets, smoke detector, smoking near bedding or upholstery.   Dental health: Discussed importance of regular tooth brushing, flossing, and dental visits.   Follow up plan: NEXT PREVENTATIVE PHYSICAL DUE IN 1 YEAR. Return in about 6 months (around 12/13/2018).

## 2018-06-14 ENCOUNTER — Ambulatory Visit (INDEPENDENT_AMBULATORY_CARE_PROVIDER_SITE_OTHER): Payer: BLUE CROSS/BLUE SHIELD | Admitting: Family Medicine

## 2018-06-14 ENCOUNTER — Encounter: Payer: Self-pay | Admitting: Family Medicine

## 2018-06-14 VITALS — BP 132/72 | HR 75 | Temp 98.3°F | Ht 70.5 in | Wt 216.4 lb

## 2018-06-14 DIAGNOSIS — Z0000 Encounter for general adult medical examination without abnormal findings: Secondary | ICD-10-CM | POA: Diagnosis not present

## 2018-06-14 DIAGNOSIS — I10 Essential (primary) hypertension: Secondary | ICD-10-CM | POA: Diagnosis not present

## 2018-06-14 DIAGNOSIS — R251 Tremor, unspecified: Secondary | ICD-10-CM | POA: Diagnosis not present

## 2018-06-14 DIAGNOSIS — Z23 Encounter for immunization: Secondary | ICD-10-CM | POA: Diagnosis not present

## 2018-06-14 LAB — UA/M W/RFLX CULTURE, ROUTINE
Bilirubin, UA: NEGATIVE
Glucose, UA: NEGATIVE
KETONES UA: NEGATIVE
LEUKOCYTES UA: NEGATIVE
NITRITE UA: NEGATIVE
Protein, UA: NEGATIVE
RBC, UA: NEGATIVE
Specific Gravity, UA: <1.005 — ABNORMAL LOW (ref 1.005–1.030)
UUROB: 0.2 mg/dL (ref 0.2–1.0)
pH, UA: 6.5 (ref 5.0–7.5)

## 2018-06-14 LAB — MICROALBUMIN, URINE WAIVED
Creatinine, Urine Waived: 10 mg/dL (ref 10–300)
Microalb, Ur Waived: 10 mg/L (ref 0–19)

## 2018-06-14 LAB — BAYER DCA HB A1C WAIVED: HB A1C (BAYER DCA - WAIVED): 5.2 % (ref <7.0)

## 2018-06-14 MED ORDER — LISINOPRIL 20 MG PO TABS: 20.0000 mg | ORAL_TABLET | Freq: Every day | ORAL | 1 refills | Status: DC

## 2018-06-14 NOTE — Patient Instructions (Signed)

## 2018-06-14 NOTE — Assessment & Plan Note (Signed)
Under good control on recheck. Refills given. Continue to monitor. Call with any concerns.

## 2018-06-15 LAB — CBC WITH DIFFERENTIAL/PLATELET
BASOS ABS: 0.1 10*3/uL (ref 0.0–0.2)
Basos: 1 %
EOS (ABSOLUTE): 0.5 10*3/uL — AB (ref 0.0–0.4)
Eos: 8 %
Hematocrit: 44.3 % (ref 37.5–51.0)
Hemoglobin: 15.2 g/dL (ref 13.0–17.7)
Immature Grans (Abs): 0 10*3/uL (ref 0.0–0.1)
Immature Granulocytes: 0 %
LYMPHS ABS: 1.5 10*3/uL (ref 0.7–3.1)
LYMPHS: 25 %
MCH: 29.3 pg (ref 26.6–33.0)
MCHC: 34.3 g/dL (ref 31.5–35.7)
MCV: 85 fL (ref 79–97)
MONOCYTES: 14 %
MONOS ABS: 0.9 10*3/uL (ref 0.1–0.9)
NEUTROS PCT: 52 %
Neutrophils Absolute: 3.2 10*3/uL (ref 1.4–7.0)
Platelets: 279 10*3/uL (ref 150–450)
RBC: 5.19 x10E6/uL (ref 4.14–5.80)
RDW: 13.2 % (ref 12.3–15.4)
WBC: 6.2 10*3/uL (ref 3.4–10.8)

## 2018-06-15 LAB — LIPID PANEL W/O CHOL/HDL RATIO
Cholesterol, Total: 151 mg/dL (ref 100–199)
HDL: 53 mg/dL (ref >39)
LDL Calculated: 83 mg/dL (ref 0–99)
TRIGLYCERIDES: 76 mg/dL (ref 0–149)
VLDL Cholesterol Cal: 15 mg/dL (ref 5–40)

## 2018-06-15 LAB — COMPREHENSIVE METABOLIC PANEL
A/G RATIO: 1.9 (ref 1.2–2.2)
ALK PHOS: 80 IU/L (ref 39–117)
ALT: 22 IU/L (ref 0–44)
AST: 18 IU/L (ref 0–40)
Albumin: 4.8 g/dL (ref 3.5–5.5)
BILIRUBIN TOTAL: 0.4 mg/dL (ref 0.0–1.2)
BUN/Creatinine Ratio: 14 (ref 9–20)
BUN: 14 mg/dL (ref 6–20)
CHLORIDE: 95 mmol/L — AB (ref 96–106)
CO2: 28 mmol/L (ref 20–29)
Calcium: 10 mg/dL (ref 8.7–10.2)
Creatinine, Ser: 1.03 mg/dL (ref 0.76–1.27)
GFR calc Af Amer: 115 mL/min/{1.73_m2} (ref >59)
GFR calc non Af Amer: 99 mL/min/{1.73_m2} (ref >59)
Globulin, Total: 2.5 g/dL (ref 1.5–4.5)
Glucose: 104 mg/dL — ABNORMAL HIGH (ref 65–99)
POTASSIUM: 3.9 mmol/L (ref 3.5–5.2)
Sodium: 137 mmol/L (ref 134–144)
Total Protein: 7.3 g/dL (ref 6.0–8.5)

## 2018-06-15 LAB — TSH: TSH: 2.67 u[IU]/mL (ref 0.450–4.500)

## 2018-06-16 ENCOUNTER — Encounter: Payer: Self-pay | Admitting: Family Medicine

## 2018-08-06 ENCOUNTER — Encounter: Payer: Self-pay | Admitting: Family Medicine

## 2018-08-06 ENCOUNTER — Other Ambulatory Visit: Payer: Self-pay

## 2018-08-06 ENCOUNTER — Ambulatory Visit: Payer: BLUE CROSS/BLUE SHIELD | Admitting: Family Medicine

## 2018-08-06 VITALS — BP 133/86 | HR 65 | Temp 98.3°F | Ht 70.5 in | Wt 217.0 lb

## 2018-08-06 DIAGNOSIS — R1032 Left lower quadrant pain: Secondary | ICD-10-CM

## 2018-08-06 NOTE — Patient Instructions (Signed)
High-Fiber Diet  Fiber, also called dietary fiber, is a type of carbohydrate found in fruits, vegetables, whole grains, and beans. A high-fiber diet can have many health benefits. Your health care provider may recommend a high-fiber diet to help:  · Prevent constipation. Fiber can make your bowel movements more regular.  · Lower your cholesterol.  · Relieve hemorrhoids, uncomplicated diverticulosis, or irritable bowel syndrome.  · Prevent overeating as part of a weight-loss plan.  · Prevent heart disease, type 2 diabetes, and certain cancers.    What is my plan?  The recommended daily intake of fiber includes:  · 38 grams for men under age 50.  · 30 grams for men over age 50.  · 25 grams for women under age 50.  · 21 grams for women over age 50.    You can get the recommended daily intake of dietary fiber by eating a variety of fruits, vegetables, grains, and beans. Your health care provider may also recommend a fiber supplement if it is not possible to get enough fiber through your diet.  What do I need to know about a high-fiber diet?  · Fiber supplements have not been widely studied for their effectiveness, so it is better to get fiber through food sources.  · Always check the fiber content on the nutrition facts label of any prepackaged food. Look for foods that contain at least 5 grams of fiber per serving.  · Ask your dietitian if you have questions about specific foods that are related to your condition, especially if those foods are not listed in the following section.  · Increase your daily fiber consumption gradually. Increasing your intake of dietary fiber too quickly may cause bloating, cramping, or gas.  · Drink plenty of water. Water helps you to digest fiber.  What foods can I eat?  Grains  Whole-grain breads. Multigrain cereal. Oats and oatmeal. Brown rice. Barley. Bulgur wheat. Millet. Bran muffins. Popcorn. Rye wafer crackers.  Vegetables   Sweet potatoes. Spinach. Kale. Artichokes. Cabbage. Broccoli. Green peas. Carrots. Squash.  Fruits  Berries. Pears. Apples. Oranges. Avocados. Prunes and raisins. Dried figs.  Meats and Other Protein Sources  Navy, kidney, pinto, and soy beans. Split peas. Lentils. Nuts and seeds.  Dairy  Fiber-fortified yogurt.  Beverages  Fiber-fortified soy milk. Fiber-fortified orange juice.  Other  Fiber bars.  The items listed above may not be a complete list of recommended foods or beverages. Contact your dietitian for more options.  What foods are not recommended?  Grains  White bread. Pasta made with refined flour. White rice.  Vegetables  Fried potatoes. Canned vegetables. Well-cooked vegetables.  Fruits  Fruit juice. Cooked, strained fruit.  Meats and Other Protein Sources  Fatty cuts of meat. Fried poultry or fried fish.  Dairy  Milk. Yogurt. Cream cheese. Sour cream.  Beverages  Soft drinks.  Other  Cakes and pastries. Butter and oils.  The items listed above may not be a complete list of foods and beverages to avoid. Contact your dietitian for more information.  What are some tips for including high-fiber foods in my diet?  · Eat a wide variety of high-fiber foods.  · Make sure that half of all grains consumed each day are whole grains.  · Replace breads and cereals made from refined flour or white flour with whole-grain breads and cereals.  · Replace white rice with brown rice, bulgur wheat, or millet.  · Start the day with a breakfast that is high in fiber,   such as a cereal that contains at least 5 grams of fiber per serving.  · Use beans in place of meat in soups, salads, or pasta.  · Eat high-fiber snacks, such as berries, raw vegetables, nuts, or popcorn.  This information is not intended to replace advice given to you by your health care provider. Make sure you discuss any questions you have with your health care provider.  Document Released: 09/22/2005 Document Revised: 02/28/2016 Document Reviewed: 03/07/2014   Elsevier Interactive Patient Education © 2018 [employer]

## 2018-08-06 NOTE — Progress Notes (Signed)
BP 133/86   Pulse 65   Temp 98.3 F (36.8 C) (Oral)   Ht 5' 10.5" (1.791 m)   Wt 217 lb (98.4 kg)   SpO2 100%   BMI 30.70 kg/m    Subjective:    Patient ID: Daniel Martinez, male    DOB: 1991/01/30, 27 y.o.   MRN: [id number]  HPI: Daniel Martinez is a 27 y.o. male  Chief Complaint  Patient presents with  . Abdominal Pain    pt states he feels a bump on his left lower abdomen   ABDOMINAL ISSUES Duration: couple of days Nature: aching Location: LLQ  Severity: moderate  Radiation: no Episode duration: couple of minutes Frequency: intermittent Treatments attempted: none Constipation: intermittent Diarrhea: yes Episodes of diarrhea/day: Mucous in the stool: no Heartburn: no Bloating:yes Flatulence: yes Nausea: yes Vomiting: yes Episodes of vomit/day: Melena or hematochezia: no Rash: no Jaundice: no Fever: no Weight loss: no    Relevant past medical, surgical, family and social history reviewed and updated as indicated. Interim medical history since our last visit reviewed. Allergies and medications reviewed and updated.  Review of Systems  Constitutional: Negative.   Respiratory: Negative.   Cardiovascular: Negative.   Gastrointestinal: Positive for abdominal pain, constipation, diarrhea, nausea and vomiting. Negative for abdominal distention, anal bleeding, blood in stool and rectal pain.  Neurological: Negative.   Psychiatric/Behavioral: Negative.     Per HPI unless specifically indicated above     Objective:    BP 133/86   Pulse 65   Temp 98.3 F (36.8 C) (Oral)   Ht 5' 10.5" (1.791 m)   Wt 217 lb (98.4 kg)   SpO2 100%   BMI 30.70 kg/m   Wt Readings from Last 3 Encounters:  08/06/18 217 lb (98.4 kg)  06/14/18 216 lb 7 oz (98.2 kg)  12/09/17 228 lb 7 oz (103.6 kg)    Physical Exam  Constitutional: He is oriented to person, place, and time. He appears well-developed and well-nourished. No distress.  HENT:  Head: Normocephalic and  atraumatic.  Right Ear: Hearing normal.  Left Ear: Hearing normal.  Nose: Nose normal.  Eyes: Conjunctivae and lids are normal. Right eye exhibits no discharge. Left eye exhibits no discharge. No scleral icterus.  Cardiovascular: Normal rate, regular rhythm, normal heart sounds and intact distal pulses. Exam reveals no gallop and no friction rub.  No murmur heard. Pulmonary/Chest: Effort normal and breath sounds normal. No stridor. No respiratory distress. He has no wheezes. He has no rhonchi. He has no rales. He exhibits no tenderness.  Abdominal: Soft. Normal appearance and bowel sounds are normal. He exhibits no shifting dullness, no distension, no pulsatile liver, no fluid wave, no abdominal bruit, no ascites, no pulsatile midline mass and no mass. There is no tenderness.  Musculoskeletal: Normal range of motion.  Neurological: He is alert and oriented to person, place, and time.  Skin: Skin is warm, dry and intact. Capillary refill takes less than 2 seconds. No rash noted. He is not diaphoretic. No cyanosis or erythema. No pallor.  Psychiatric: He has a normal mood and affect. His speech is normal and behavior is normal. Judgment and thought content normal. Cognition and memory are normal.  Nursing note and vitals reviewed.   Results for orders placed or performed in visit on 06/14/18  CBC with Differential/Platelet  Result Value Ref Range   WBC 6.2 3.4 - 10.8 x10E3/uL   RBC 5.19 4.14 - 5.80 x10E6/uL   Hemoglobin 15.2 13.0 -  17.7 g/dL   Hematocrit 21.3 08.6 - 51.0 %   MCV 85 79 - 97 fL   MCH 29.3 26.6 - 33.0 pg   MCHC 34.3 31.5 - 35.7 g/dL   RDW 57.8 46.9 - 62.9 %   Platelets 279 150 - 450 x10E3/uL   Neutrophils 52 Not Estab. %   Lymphs 25 Not Estab. %   Monocytes 14 Not Estab. %   Eos 8 Not Estab. %   Basos 1 Not Estab. %   Neutrophils Absolute 3.2 1.4 - 7.0 x10E3/uL   Lymphocytes Absolute 1.5 0.7 - 3.1 x10E3/uL   Monocytes Absolute 0.9 0.1 - 0.9 x10E3/uL   EOS (ABSOLUTE)  0.5 (H) 0.0 - 0.4 x10E3/uL   Basophils Absolute 0.1 0.0 - 0.2 x10E3/uL   Immature Granulocytes 0 Not Estab. %   Immature Grans (Abs) 0.0 0.0 - 0.1 x10E3/uL  Comprehensive metabolic panel  Result Value Ref Range   Glucose 104 (H) 65 - 99 mg/dL   BUN 14 6 - 20 mg/dL   Creatinine, Ser 5.28 0.76 - 1.27 mg/dL   GFR calc non Af Amer 99 >59 mL/min/1.73   GFR calc Af Amer 115 >59 mL/min/1.73   BUN/Creatinine Ratio 14 9 - 20   Sodium 137 134 - 144 mmol/L   Potassium 3.9 3.5 - 5.2 mmol/L   Chloride 95 (L) 96 - 106 mmol/L   CO2 28 20 - 29 mmol/L   Calcium 10.0 8.7 - 10.2 mg/dL   Total Protein 7.3 6.0 - 8.5 g/dL   Albumin 4.8 3.5 - 5.5 g/dL   Globulin, Total 2.5 1.5 - 4.5 g/dL   Albumin/Globulin Ratio 1.9 1.2 - 2.2   Bilirubin Total 0.4 0.0 - 1.2 mg/dL   Alkaline Phosphatase 80 39 - 117 IU/L   AST 18 0 - 40 IU/L   ALT 22 0 - 44 IU/L  Lipid Panel w/o Chol/HDL Ratio  Result Value Ref Range   Cholesterol, Total 151 100 - 199 mg/dL   Triglycerides 76 0 - 149 mg/dL   HDL 53 >41 mg/dL   VLDL Cholesterol Cal 15 5 - 40 mg/dL   LDL Calculated 83 0 - 99 mg/dL  Microalbumin, Urine Waived  Result Value Ref Range   Microalb, Ur Waived 10 0 - 19 mg/L   Creatinine, Urine Waived 10 10 - 300 mg/dL   Microalb/Creat Ratio <30 <30 mg/g  TSH  Result Value Ref Range   TSH 2.670 0.450 - 4.500 uIU/mL  UA/M w/rflx Culture, Routine  Result Value Ref Range   Specific Gravity, UA <1.005 (L) 1.005 - 1.030   pH, UA 6.5 5.0 - 7.5   Color, UA Straw Yellow   Appearance Ur Clear Clear   Leukocytes, UA Negative Negative   Protein, UA Negative Negative/Trace   Glucose, UA Negative Negative   Ketones, UA Negative Negative   RBC, UA Negative Negative   Bilirubin, UA Negative Negative   Urobilinogen, Ur 0.2 0.2 - 1.0 mg/dL   Nitrite, UA Negative Negative  Bayer DCA Hb A1c Waived  Result Value Ref Range   HB A1C (BAYER DCA - WAIVED) 5.2 <7.0 %      Assessment & Plan:   Problem List Items Addressed This  Visit    None    Visit Diagnoses    LLQ pain    -  Primary   Likely due to constipation. No sign of hernia. Start increasing fiber. Call withany concerns.        Follow up  plan: Return if symptoms worsen or fail to improve.

## 2018-12-12 NOTE — Progress Notes (Signed)
BP 128/78 (BP Location: Left Arm, Cuff Size: Normal)   Pulse 72   Temp 98.3 F (36.8 C) (Oral)   Wt 233 lb 6.4 oz (105.9 kg)   SpO2 100%   BMI 33.02 kg/m    Subjective:    Patient ID: Daniel Martinez, male    DOB: 07/17/1991, 28 y.o.   MRN: [id number]  HPI: Daniel Martinez is a 28 y.o. male  Chief Complaint  Patient presents with  . Hypertension   HYPERTENSION- hasn't taken his medicine today. Hypertension status: stable  Satisfied with current treatment? yes Duration of hypertension: chronic BP monitoring frequency:  not checking BP medication side effects:  no Medication compliance: excellent compliance Previous BP meds: Aspirin: no Recurrent headaches: no Visual changes: no Palpitations: no Dyspnea: no Chest pain: no Lower extremity edema: no Dizzy/lightheaded: no  Relevant past medical, surgical, family and social history reviewed and updated as indicated. Interim medical history since our last visit reviewed. Allergies and medications reviewed and updated.  Review of Systems  Constitutional: Negative.   Respiratory: Negative.   Cardiovascular: Negative.   Psychiatric/Behavioral: Negative.     Per HPI unless specifically indicated above     Objective:    BP 128/78 (BP Location: Left Arm, Cuff Size: Normal)   Pulse 72   Temp 98.3 F (36.8 C) (Oral)   Wt 233 lb 6.4 oz (105.9 kg)   SpO2 100%   BMI 33.02 kg/m   Wt Readings from Last 3 Encounters:  12/13/18 233 lb 6.4 oz (105.9 kg)  08/06/18 217 lb (98.4 kg)  06/14/18 216 lb 7 oz (98.2 kg)    Physical Exam Vitals signs and nursing note reviewed.  Constitutional:      General: He is not in acute distress.    Appearance: Normal appearance. He is not ill-appearing, toxic-appearing or diaphoretic.  HENT:     Head: Normocephalic and atraumatic.     Right Ear: External ear normal.     Left Ear: External ear normal.     Nose: Nose normal.     Mouth/Throat:     Mouth: Mucous membranes are  moist.     Pharynx: Oropharynx is clear.  Eyes:     General: No scleral icterus.       Right eye: No discharge.        Left eye: No discharge.     Extraocular Movements: Extraocular movements intact.     Conjunctiva/sclera: Conjunctivae normal.     Pupils: Pupils are equal, round, and reactive to light.  Neck:     Musculoskeletal: Normal range of motion and neck supple.  Cardiovascular:     Rate and Rhythm: Normal rate and regular rhythm.     Pulses: Normal pulses.     Heart sounds: Normal heart sounds. No murmur. No friction rub. No gallop.   Pulmonary:     Effort: Pulmonary effort is normal. No respiratory distress.     Breath sounds: Normal breath sounds. No stridor. No wheezing, rhonchi or rales.  Chest:     Chest wall: No tenderness.  Musculoskeletal: Normal range of motion.  Skin:    General: Skin is warm and dry.     Capillary Refill: Capillary refill takes less than 2 seconds.     Coloration: Skin is not jaundiced or pale.     Findings: No bruising, erythema, lesion or rash.  Neurological:     General: No focal deficit present.     Mental Status: He is alert and  oriented to person, place, and time. Mental status is at baseline.  Psychiatric:        Mood and Affect: Mood normal.        Behavior: Behavior normal.        Thought Content: Thought content normal.        Judgment: Judgment normal.     Results for orders placed or performed in visit on 06/14/18  CBC with Differential/Platelet  Result Value Ref Range   WBC 6.2 3.4 - 10.8 x10E3/uL   RBC 5.19 4.14 - 5.80 x10E6/uL   Hemoglobin 15.2 13.0 - 17.7 g/dL   Hematocrit 63.8 45.3 - 51.0 %   MCV 85 79 - 97 fL   MCH 29.3 26.6 - 33.0 pg   MCHC 34.3 31.5 - 35.7 g/dL   RDW 64.6 80.3 - 21.2 %   Platelets 279 150 - 450 x10E3/uL   Neutrophils 52 Not Estab. %   Lymphs 25 Not Estab. %   Monocytes 14 Not Estab. %   Eos 8 Not Estab. %   Basos 1 Not Estab. %   Neutrophils Absolute 3.2 1.4 - 7.0 x10E3/uL   Lymphocytes  Absolute 1.5 0.7 - 3.1 x10E3/uL   Monocytes Absolute 0.9 0.1 - 0.9 x10E3/uL   EOS (ABSOLUTE) 0.5 (H) 0.0 - 0.4 x10E3/uL   Basophils Absolute 0.1 0.0 - 0.2 x10E3/uL   Immature Granulocytes 0 Not Estab. %   Immature Grans (Abs) 0.0 0.0 - 0.1 x10E3/uL  Comprehensive metabolic panel  Result Value Ref Range   Glucose 104 (H) 65 - 99 mg/dL   BUN 14 6 - 20 mg/dL   Creatinine, Ser 2.48 0.76 - 1.27 mg/dL   GFR calc non Af Amer 99 >59 mL/min/1.73   GFR calc Af Amer 115 >59 mL/min/1.73   BUN/Creatinine Ratio 14 9 - 20   Sodium 137 134 - 144 mmol/L   Potassium 3.9 3.5 - 5.2 mmol/L   Chloride 95 (L) 96 - 106 mmol/L   CO2 28 20 - 29 mmol/L   Calcium 10.0 8.7 - 10.2 mg/dL   Total Protein 7.3 6.0 - 8.5 g/dL   Albumin 4.8 3.5 - 5.5 g/dL   Globulin, Total 2.5 1.5 - 4.5 g/dL   Albumin/Globulin Ratio 1.9 1.2 - 2.2   Bilirubin Total 0.4 0.0 - 1.2 mg/dL   Alkaline Phosphatase 80 39 - 117 IU/L   AST 18 0 - 40 IU/L   ALT 22 0 - 44 IU/L  Lipid Panel w/o Chol/HDL Ratio  Result Value Ref Range   Cholesterol, Total 151 100 - 199 mg/dL   Triglycerides 76 0 - 149 mg/dL   HDL 53 >25 mg/dL   VLDL Cholesterol Cal 15 5 - 40 mg/dL   LDL Calculated 83 0 - 99 mg/dL  Microalbumin, Urine Waived  Result Value Ref Range   Microalb, Ur Waived 10 0 - 19 mg/L   Creatinine, Urine Waived 10 10 - 300 mg/dL   Microalb/Creat Ratio <30 <30 mg/g  TSH  Result Value Ref Range   TSH 2.670 0.450 - 4.500 uIU/mL  UA/M w/rflx Culture, Routine  Result Value Ref Range   Specific Gravity, UA <1.005 (L) 1.005 - 1.030   pH, UA 6.5 5.0 - 7.5   Color, UA Straw Yellow   Appearance Ur Clear Clear   Leukocytes, UA Negative Negative   Protein, UA Negative Negative/Trace   Glucose, UA Negative Negative   Ketones, UA Negative Negative   RBC, UA Negative Negative   Bilirubin,  UA Negative Negative   Urobilinogen, Ur 0.2 0.2 - 1.0 mg/dL   Nitrite, UA Negative Negative  Bayer DCA Hb A1c Waived  Result Value Ref Range   HB A1C (BAYER  DCA - WAIVED) 5.2 <7.0 %      Assessment & Plan:   Problem List Items Addressed This Visit      Cardiovascular and Mediastinum   Essential hypertension - Primary    Under good control on current regimen. Continue current regimen. Continue to monitor. Call with any concerns. Refills given. Labs drawn today.       Relevant Medications   lisinopril (PRINIVIL,ZESTRIL) 20 MG tablet   Other Relevant Orders   Basic metabolic panel       Follow up plan: Return in about 6 months (around 06/15/2019) for Physical.

## 2018-12-13 ENCOUNTER — Encounter: Payer: Self-pay | Admitting: Family Medicine

## 2018-12-13 ENCOUNTER — Ambulatory Visit (INDEPENDENT_AMBULATORY_CARE_PROVIDER_SITE_OTHER): Payer: BLUE CROSS/BLUE SHIELD | Admitting: Family Medicine

## 2018-12-13 VITALS — BP 128/78 | HR 72 | Temp 98.3°F | Wt 233.4 lb

## 2018-12-13 DIAGNOSIS — I10 Essential (primary) hypertension: Secondary | ICD-10-CM

## 2018-12-13 MED ORDER — LISINOPRIL 20 MG PO TABS: 20.0000 mg | ORAL_TABLET | Freq: Every day | ORAL | 1 refills | Status: DC

## 2018-12-13 NOTE — Assessment & Plan Note (Signed)
Under good control on current regimen. Continue current regimen. Continue to monitor. Call with any concerns. Refills given. Labs drawn today.   

## 2018-12-14 LAB — BASIC METABOLIC PANEL
BUN/Creatinine Ratio: 13 (ref 9–20)
BUN: 13 mg/dL (ref 6–20)
CALCIUM: 9.4 mg/dL (ref 8.7–10.2)
CHLORIDE: 100 mmol/L (ref 96–106)
CO2: 25 mmol/L (ref 20–29)
Creatinine, Ser: 0.98 mg/dL (ref 0.76–1.27)
GFR calc Af Amer: 122 mL/min/{1.73_m2} (ref >59)
GFR, EST NON AFRICAN AMERICAN: 105 mL/min/{1.73_m2} (ref >59)
GLUCOSE: 87 mg/dL (ref 65–99)
POTASSIUM: 4.7 mmol/L (ref 3.5–5.2)
SODIUM: 139 mmol/L (ref 134–144)

## 2019-06-21 ENCOUNTER — Encounter: Payer: BLUE CROSS/BLUE SHIELD | Admitting: Family Medicine

## 2019-06-23 ENCOUNTER — Encounter: Payer: Self-pay | Admitting: Family Medicine

## 2019-08-10 DIAGNOSIS — Z20828 Contact with and (suspected) exposure to other viral communicable diseases: Secondary | ICD-10-CM | POA: Diagnosis not present

## 2019-08-19 ENCOUNTER — Encounter: Payer: Self-pay | Admitting: Family Medicine

## 2019-08-25 DIAGNOSIS — Z20828 Contact with and (suspected) exposure to other viral communicable diseases: Secondary | ICD-10-CM | POA: Diagnosis not present

## 2019-08-31 DIAGNOSIS — Z20828 Contact with and (suspected) exposure to other viral communicable diseases: Secondary | ICD-10-CM | POA: Diagnosis not present

## 2019-09-28 DIAGNOSIS — Z20828 Contact with and (suspected) exposure to other viral communicable diseases: Secondary | ICD-10-CM | POA: Diagnosis not present

## 2019-10-07 HISTORY — PX: WISDOM TOOTH EXTRACTION: SHX21

## 2019-10-22 DIAGNOSIS — Z20828 Contact with and (suspected) exposure to other viral communicable diseases: Secondary | ICD-10-CM | POA: Diagnosis not present

## 2019-10-25 ENCOUNTER — Ambulatory Visit: Payer: BC Managed Care – PPO | Attending: Internal Medicine

## 2019-10-25 DIAGNOSIS — Z20822 Contact with and (suspected) exposure to covid-19: Secondary | ICD-10-CM

## 2019-10-26 ENCOUNTER — Telehealth: Payer: Self-pay | Admitting: *Deleted

## 2019-10-26 LAB — NOVEL CORONAVIRUS, NAA: SARS-CoV-2, NAA: NOT DETECTED

## 2019-10-26 NOTE — Telephone Encounter (Signed)
Patient called ,results still pending. 

## 2019-11-12 DIAGNOSIS — Z20828 Contact with and (suspected) exposure to other viral communicable diseases: Secondary | ICD-10-CM | POA: Diagnosis not present

## 2019-11-22 ENCOUNTER — Other Ambulatory Visit: Payer: Self-pay | Admitting: Family Medicine

## 2019-11-23 ENCOUNTER — Encounter: Payer: Self-pay | Admitting: Family Medicine

## 2019-11-23 NOTE — Telephone Encounter (Signed)
Requested medication (s) are due for refill today: yes  Requested medication (s) are on the active medication list: yes  Last refill:  12/13/2018  Future visit scheduled: no  Notes to clinic:  no valid encounter within last 6 months    Requested Prescriptions  Pending Prescriptions Disp Refills   lisinopril (ZESTRIL) 20 MG tablet [Pharmacy Med Name: LISINOPRIL 20MG  TABLETS] 90 tablet 1    Sig: TAKE 1 TABLET BY MOUTH DAILY      Cardiovascular:  ACE Inhibitors Failed - 11/22/2019  7:57 PM      Failed - Cr in normal range and within 180 days    Creatinine, Ser  Date Value Ref Range Status  12/13/2018 0.98 0.76 - 1.27 mg/dL Final          Failed - K in normal range and within 180 days    Potassium  Date Value Ref Range Status  12/13/2018 4.7 3.5 - 5.2 mmol/L Final          Failed - Valid encounter within last 6 months    Recent Outpatient Visits           11 months ago Essential hypertension   [facility] [place], Megan P, DO   1 year ago LLQ pain   [facility] [place], Megan P, DO   1 year ago Routine general medical examination at a health care facility   [facility] [place], [place], DO   1 year ago Essential hypertension   [facility] [place], [place], DO   2 years ago Routine general medical examination at a health care facility   [facility], [place], [place]              Passed - Patient is not pregnant      Passed - Last BP in normal range    BP Readings from Last 1 Encounters:  12/13/18 128/78

## 2019-11-23 NOTE — Telephone Encounter (Signed)
Called pt to schedule, no answer, vm full. Sending mychart message and letter.

## 2019-11-23 NOTE — Telephone Encounter (Signed)
Patient last seen in March 2020. Please call and schedule f/up and then route to provider once scheduled to refill.

## 2019-11-23 NOTE — Telephone Encounter (Signed)
Please see below.

## 2019-12-25 ENCOUNTER — Other Ambulatory Visit: Payer: Self-pay | Admitting: Family Medicine

## 2019-12-25 NOTE — Telephone Encounter (Signed)
TC to patient left VM to call for appointment in order to continue refills for Lisinopril. Routing to provider for consideration.

## 2019-12-26 ENCOUNTER — Encounter: Payer: Self-pay | Admitting: Family Medicine

## 2019-12-26 NOTE — Telephone Encounter (Signed)
Called pt to schedule appt, no answer, left vm. Sending letter. Routing to close.

## 2019-12-27 ENCOUNTER — Ambulatory Visit (INDEPENDENT_AMBULATORY_CARE_PROVIDER_SITE_OTHER): Payer: BC Managed Care – PPO | Admitting: Family Medicine

## 2019-12-27 ENCOUNTER — Encounter: Payer: Self-pay | Admitting: Family Medicine

## 2019-12-27 ENCOUNTER — Other Ambulatory Visit: Payer: Self-pay

## 2019-12-27 VITALS — BP 138/77 | HR 72 | Temp 98.0°F | Ht 71.26 in | Wt 237.2 lb

## 2019-12-27 DIAGNOSIS — I10 Essential (primary) hypertension: Secondary | ICD-10-CM

## 2019-12-27 DIAGNOSIS — Z0000 Encounter for general adult medical examination without abnormal findings: Secondary | ICD-10-CM

## 2019-12-27 DIAGNOSIS — Z136 Encounter for screening for cardiovascular disorders: Secondary | ICD-10-CM | POA: Diagnosis not present

## 2019-12-27 LAB — MICROALBUMIN, URINE WAIVED
Creatinine, Urine Waived: 200 mg/dL (ref 10–300)
Microalb, Ur Waived: 30 mg/L — ABNORMAL HIGH (ref 0–19)
Microalb/Creat Ratio: <30 mg/g (ref <30)

## 2019-12-27 LAB — UA/M W/RFLX CULTURE, ROUTINE
Bilirubin, UA: NEGATIVE
Glucose, UA: NEGATIVE
Ketones, UA: NEGATIVE
Leukocytes,UA: NEGATIVE
Nitrite, UA: NEGATIVE
Protein,UA: NEGATIVE
RBC, UA: NEGATIVE
Specific Gravity, UA: 1.025 (ref 1.005–1.030)
Urobilinogen, Ur: 0.2 mg/dL (ref 0.2–1.0)
pH, UA: 5.5 (ref 5.0–7.5)

## 2019-12-27 MED ORDER — LISINOPRIL 20 MG PO TABS: 20.0000 mg | ORAL_TABLET | Freq: Every day | ORAL | 1 refills | Status: DC

## 2019-12-27 NOTE — Assessment & Plan Note (Signed)
Under good control on current regimen. Continue current regimen. Continue to monitor. Call with any concerns. Refills given. Labs drawn today.   

## 2019-12-27 NOTE — Patient Instructions (Signed)

## 2019-12-27 NOTE — Progress Notes (Signed)
BP 138/77 (BP Location: Left Arm, Patient Position: Sitting, Cuff Size: Normal)   Pulse 72   Temp 98 F (36.7 C) (Oral)   Ht 5' 11.26" (1.81 m)   Wt 237 lb 4 oz (107.6 kg)   SpO2 98%   BMI 32.85 kg/m    Subjective:    Patient ID: Daniel Martinez, male    DOB: [birth year], 29 y.o.   MRN: [id number]  HPI: ELIHU MILSTEIN is a 29 y.o. male presenting on 12/27/2019 for comprehensive medical examination. Current medical complaints include:  HYPERTENSION Hypertension status: controlled  Satisfied with current treatment? yes Duration of hypertension: chronic BP monitoring frequency:  not checking BP medication side effects:  no Medication compliance: excellent compliance Previous BP meds: lisinopril Aspirin: no Recurrent headaches: no Visual changes: no Palpitations: no Dyspnea: no Chest pain: no Lower extremity edema: no Dizzy/lightheaded: yes- getting up too fast  Interim Problems from his last visit: no  Depression Screen done today and results listed below:  Depression screen [facility] 2/9 12/27/2019 12/09/2017 06/13/2016  Decreased Interest 1 3 0  Down, Depressed, Hopeless 1 3 0  PHQ - 2 Score 2 6 0  Altered sleeping 1 2 1   Tired, decreased energy 1 1 1   Change in appetite 0 0 0  Feeling bad or failure about yourself  0 0 0  Trouble concentrating 0 0 0  Moving slowly or fidgety/restless 0 0 0  Suicidal thoughts 0 0 0  PHQ-9 Score 4 9 2   Difficult doing work/chores Not difficult at all Somewhat difficult -    Past Medical History:  History reviewed. No pertinent past medical history.  Surgical History:  History reviewed. No pertinent surgical history.  Medications:  No current outpatient medications on file prior to visit.   No current facility-administered medications on file prior to visit.    Allergies:  No Known Allergies  Social History:  Social History   Socioeconomic History  . Marital status: Single    Spouse name: Not on file  . Number of  children: Not on file  . Years of education: Not on file  . Highest education level: Not on file  Occupational History  . Not on file  Tobacco Use  . Smoking status: Never Smoker  . Smokeless tobacco: Never Used  Substance and Sexual Activity  . Alcohol use: No  . Drug use: No  . Sexual activity: Not Currently  Other Topics Concern  . Not on file  Social History Narrative  . Not on file   Social Determinants of Health   Financial Resource Strain:   . Difficulty of Paying Living Expenses:   Food Insecurity:   . Worried About in the Last Year:   . in the Last Year:   Transportation Needs:   . (Medical):   Programme researcher, broadcasting/film/video Lack of Transportation (Non-Medical):   Physical Activity:   . Days of Exercise per Week:   . Minutes of Exercise per Session:   Stress:   . Feeling of Stress :   Social Connections:   . Frequency of Communication with Friends and Family:   . Frequency of Social Gatherings with Friends and Family:   . Attends Religious Services:   . Active Member of Clubs or Organizations:   . Attends Barista Meetings:   Freight forwarder Marital Status:   Intimate Partner Violence:   . Fear of Current or Ex-Partner:   . Emotionally Abused:   .  Physically Abused:   . Sexually Abused:    Social History   Tobacco Use  Smoking Status Never Smoker  Smokeless Tobacco Never Used   Social History   Substance and Sexual Activity  Alcohol Use No    Family History:  Family History  Problem Relation Age of Onset  . Diabetes Mother     Past medical history, surgical history, medications, allergies, family history and social history reviewed with patient today and changes made to appropriate areas of the chart.   Review of Systems  Constitutional: Positive for malaise/fatigue. Negative for chills, diaphoresis, fever and weight loss.  HENT: Negative.   Eyes: Negative.   Respiratory: Negative.   Cardiovascular: Positive  for palpitations. Negative for chest pain, orthopnea, claudication, leg swelling and PND.  Gastrointestinal: Positive for heartburn. Negative for abdominal pain, blood in stool, constipation, diarrhea, melena, nausea and vomiting.  Genitourinary: Negative.   Musculoskeletal: Positive for myalgias. Negative for back pain, falls, joint pain and neck pain.  Skin: Negative.   Neurological: Positive for dizziness. Negative for tingling, tremors, sensory change, speech change, focal weakness, seizures, loss of consciousness, weakness and headaches.  Endo/Heme/Allergies: Negative.   Psychiatric/Behavioral: Negative.     All other ROS negative except what is listed above and in the HPI.      Objective:    BP 138/77 (BP Location: Left Arm, Patient Position: Sitting, Cuff Size: Normal)   Pulse 72   Temp 98 F (36.7 C) (Oral)   Ht 5' 11.26" (1.81 m)   Wt 237 lb 4 oz (107.6 kg)   SpO2 98%   BMI 32.85 kg/m   Wt Readings from Last 3 Encounters:  12/27/19 237 lb 4 oz (107.6 kg)  12/13/18 233 lb 6.4 oz (105.9 kg)  08/06/18 217 lb (98.4 kg)    Physical Exam Vitals and nursing note reviewed.  Constitutional:      General: He is not in acute distress.    Appearance: Normal appearance. He is obese. He is not ill-appearing, toxic-appearing or diaphoretic.  HENT:     Head: Normocephalic and atraumatic.     Right Ear: Tympanic membrane, ear canal and external ear normal. There is no impacted cerumen.     Left Ear: Tympanic membrane, ear canal and external ear normal. There is no impacted cerumen.     Nose: Nose normal. No congestion or rhinorrhea.     Mouth/Throat:     Mouth: Mucous membranes are moist.     Pharynx: Oropharynx is clear. No oropharyngeal exudate or posterior oropharyngeal erythema.  Eyes:     General: No scleral icterus.       Right eye: No discharge.        Left eye: No discharge.     Extraocular Movements: Extraocular movements intact.     Conjunctiva/sclera: Conjunctivae  normal.     Pupils: Pupils are equal, round, and reactive to light.  Neck:     Vascular: No carotid bruit.  Cardiovascular:     Rate and Rhythm: Normal rate and regular rhythm.     Pulses: Normal pulses.     Heart sounds: No murmur. No friction rub. No gallop.   Pulmonary:     Effort: Pulmonary effort is normal. No respiratory distress.     Breath sounds: Normal breath sounds. No stridor. No wheezing, rhonchi or rales.  Chest:     Chest wall: No tenderness.  Abdominal:     General: Abdomen is flat. Bowel sounds are normal. There is no distension.  Palpations: Abdomen is soft. There is no mass.     Tenderness: There is no abdominal tenderness. There is no right CVA tenderness, left CVA tenderness, guarding or rebound.     Hernia: No hernia is present.  Genitourinary:    Comments: Genital exam deferred with shared decision making Musculoskeletal:        General: No swelling, tenderness, deformity or signs of injury.     Cervical back: Normal range of motion and neck supple. No rigidity. No muscular tenderness.     Right lower leg: No edema.     Left lower leg: No edema.  Lymphadenopathy:     Cervical: No cervical adenopathy.  Skin:    General: Skin is warm and dry.     Capillary Refill: Capillary refill takes less than 2 seconds.     Coloration: Skin is not jaundiced or pale.     Findings: No bruising, erythema, lesion or rash.  Neurological:     General: No focal deficit present.     Mental Status: He is alert and oriented to person, place, and time.     Cranial Nerves: No cranial nerve deficit.     Sensory: No sensory deficit.     Motor: No weakness.     Coordination: Coordination normal.     Gait: Gait normal.     Deep Tendon Reflexes: Reflexes normal.  Psychiatric:        Mood and Affect: Mood normal.        Behavior: Behavior normal.        Thought Content: Thought content normal.        Judgment: Judgment normal.     Results for orders placed or performed in  visit on 10/25/19  Novel Coronavirus, NAA ([facility])   Specimen: Nasopharyngeal(NP) swabs in vial transport medium   NASOPHARYNGE  TESTING  Result Value Ref Range   SARS-CoV-2, NAA Not Detected Not Detected      Assessment & Plan:   Problem List Items Addressed This Visit      Cardiovascular and Mediastinum   Essential hypertension    Under good control on current regimen. Continue current regimen. Continue to monitor. Call with any concerns. Refills given. Labs drawn today.       Relevant Medications   lisinopril (ZESTRIL) 20 MG tablet   Other Relevant Orders   Microalbumin, Urine Waived    Other Visit Diagnoses    Routine general medical examination at a health care facility    -  Primary   Vaccines up to date. Screening labs checked today. Continue diet and exercise. Call with any concerns.    Relevant Orders   CBC with Differential/Platelet   Comprehensive metabolic panel   Lipid Panel w/o Chol/HDL Ratio   TSH   UA/M w/rflx Culture, Routine      LABORATORY TESTING:  Health maintenance labs ordered today as discussed above.   IMMUNIZATIONS:   - Tdap: Tetanus vaccination status reviewed: last tetanus booster within 10 years. - Influenza: Postponed to flu season - Pneumovax: Not applicable   PATIENT COUNSELING:    Sexuality: Discussed sexually transmitted diseases, partner selection, use of condoms, avoidance of unintended pregnancy  and contraceptive alternatives.   Advised to avoid cigarette smoking.  I discussed with the patient that most people either abstain from alcohol or drink within safe limits (<=14/week and <=4 drinks/occasion for males, <=7/weeks and <= 3 drinks/occasion for females) and that the risk for alcohol disorders and other health effects rises proportionally with the  number of drinks per week and how often a drinker exceeds daily limits.  Discussed cessation/primary prevention of drug use and availability of treatment for abuse.   Diet:  Encouraged to adjust caloric intake to maintain  or achieve ideal body weight, to reduce intake of dietary saturated fat and total fat, to limit sodium intake by avoiding high sodium foods and not adding table salt, and to maintain adequate dietary potassium and calcium preferably from fresh fruits, vegetables, and low-fat dairy products.    stressed the importance of regular exercise  Injury prevention: Discussed safety belts, safety helmets, smoke detector, smoking near bedding or upholstery.   Dental health: Discussed importance of regular tooth brushing, flossing, and dental visits.   Follow up plan: NEXT PREVENTATIVE PHYSICAL DUE IN 1 YEAR. Return in about 6 months (around 06/28/2020).

## 2019-12-28 LAB — CBC WITH DIFFERENTIAL/PLATELET
Basophils Absolute: 0.1 10*3/uL (ref 0.0–0.2)
Basos: 1 %
EOS (ABSOLUTE): 0.4 10*3/uL (ref 0.0–0.4)
Eos: 5 %
Hematocrit: 47.7 % (ref 37.5–51.0)
Hemoglobin: 15.8 g/dL (ref 13.0–17.7)
Immature Grans (Abs): 0 10*3/uL (ref 0.0–0.1)
Immature Granulocytes: 0 %
Lymphocytes Absolute: 1.7 10*3/uL (ref 0.7–3.1)
Lymphs: 24 %
MCH: 28.5 pg (ref 26.6–33.0)
MCHC: 33.1 g/dL (ref 31.5–35.7)
MCV: 86 fL (ref 79–97)
Monocytes Absolute: 0.5 10*3/uL (ref 0.1–0.9)
Monocytes: 7 %
Neutrophils Absolute: 4.5 10*3/uL (ref 1.4–7.0)
Neutrophils: 63 %
Platelets: 295 10*3/uL (ref 150–450)
RBC: 5.54 x10E6/uL (ref 4.14–5.80)
RDW: 13.3 % (ref 11.6–15.4)
WBC: 7.2 10*3/uL (ref 3.4–10.8)

## 2019-12-28 LAB — COMPREHENSIVE METABOLIC PANEL
ALT: 29 IU/L (ref 0–44)
AST: 24 IU/L (ref 0–40)
Albumin/Globulin Ratio: 1.9 (ref 1.2–2.2)
Albumin: 5.1 g/dL (ref 4.1–5.2)
Alkaline Phosphatase: 77 IU/L (ref 39–117)
BUN/Creatinine Ratio: 10 (ref 9–20)
BUN: 10 mg/dL (ref 6–20)
Bilirubin Total: 0.7 mg/dL (ref 0.0–1.2)
CO2: 23 mmol/L (ref 20–29)
Calcium: 9.9 mg/dL (ref 8.7–10.2)
Chloride: 100 mmol/L (ref 96–106)
Creatinine, Ser: 1.03 mg/dL (ref 0.76–1.27)
GFR calc Af Amer: 114 mL/min/{1.73_m2} (ref >59)
GFR calc non Af Amer: 98 mL/min/{1.73_m2} (ref >59)
Globulin, Total: 2.7 g/dL (ref 1.5–4.5)
Glucose: 87 mg/dL (ref 65–99)
Potassium: 4.7 mmol/L (ref 3.5–5.2)
Sodium: 139 mmol/L (ref 134–144)
Total Protein: 7.8 g/dL (ref 6.0–8.5)

## 2019-12-28 LAB — LIPID PANEL W/O CHOL/HDL RATIO
Cholesterol, Total: 157 mg/dL (ref 100–199)
HDL: 57 mg/dL (ref >39)
LDL Chol Calc (NIH): 86 mg/dL (ref 0–99)
Triglycerides: 75 mg/dL (ref 0–149)
VLDL Cholesterol Cal: 14 mg/dL (ref 5–40)

## 2019-12-28 LAB — TSH: TSH: 1.3 u[IU]/mL (ref 0.450–4.500)

## 2020-02-07 ENCOUNTER — Other Ambulatory Visit: Payer: Self-pay

## 2020-02-07 ENCOUNTER — Ambulatory Visit (INDEPENDENT_AMBULATORY_CARE_PROVIDER_SITE_OTHER): Payer: BC Managed Care – PPO | Admitting: Family Medicine

## 2020-02-07 ENCOUNTER — Encounter: Payer: Self-pay | Admitting: Family Medicine

## 2020-02-07 VITALS — BP 156/90 | HR 78 | Temp 97.9°F | Wt 245.4 lb

## 2020-02-07 DIAGNOSIS — R229 Localized swelling, mass and lump, unspecified: Secondary | ICD-10-CM | POA: Diagnosis not present

## 2020-02-07 DIAGNOSIS — M25372 Other instability, left ankle: Secondary | ICD-10-CM

## 2020-02-07 NOTE — Progress Notes (Signed)
BP (!) 156/90 (BP Location: Left Arm, Patient Position: Sitting, Cuff Size: Normal)   Pulse 78   Temp 97.9 F (36.6 C) (Oral)   Wt 245 lb 6.4 oz (111.3 kg)   SpO2 99%   BMI 33.98 kg/m    Subjective:    Patient ID: Daniel Martinez, male    DOB: 02-Sep-1991, 29 y.o.   MRN: [id number]  HPI: Daniel Martinez is a 29 y.o. male  Chief Complaint  Patient presents with  . Cyst    left foot, not painful    LUMP Duration: chronic Location: lateral side of L foot Onset: unknown Painful: no Discomfort: no Status:  not changing Trauma: no Redness: no Bruising: no Recent infection: no Swollen lymph nodes: no Requesting removal: no History of cancer: no Family history of cancer: no History of the same: no  Notes that his L ankle has been sprained many times and now clicks. Doesn't hurt much, but feels like it's going to give out on him.   Relevant past medical, surgical, family and social history reviewed and updated as indicated. Interim medical history since our last visit reviewed. Allergies and medications reviewed and updated.  Review of Systems  Constitutional: Negative.   Respiratory: Negative.   Cardiovascular: Negative.   Gastrointestinal: Negative.   Musculoskeletal: Positive for arthralgias. Negative for back pain, gait problem, joint swelling, myalgias, neck pain and neck stiffness.  Skin: Negative.   Psychiatric/Behavioral: Negative.     Per HPI unless specifically indicated above     Objective:    BP (!) 156/90 (BP Location: Left Arm, Patient Position: Sitting, Cuff Size: Normal)   Pulse 78   Temp 97.9 F (36.6 C) (Oral)   Wt 245 lb 6.4 oz (111.3 kg)   SpO2 99%   BMI 33.98 kg/m   Wt Readings from Last 3 Encounters:  02/07/20 245 lb 6.4 oz (111.3 kg)  12/27/19 237 lb 4 oz (107.6 kg)  12/13/18 233 lb 6.4 oz (105.9 kg)    Physical Exam Vitals and nursing note reviewed.  Constitutional:      General: He is not in acute distress.    Appearance:  Normal appearance. He is not ill-appearing, toxic-appearing or diaphoretic.  HENT:     Head: Normocephalic and atraumatic.     Right Ear: External ear normal.     Left Ear: External ear normal.     Nose: Nose normal.     Mouth/Throat:     Mouth: Mucous membranes are moist.     Pharynx: Oropharynx is clear.  Eyes:     General: No scleral icterus.       Right eye: No discharge.        Left eye: No discharge.     Extraocular Movements: Extraocular movements intact.     Conjunctiva/sclera: Conjunctivae normal.     Pupils: Pupils are equal, round, and reactive to light.  Cardiovascular:     Rate and Rhythm: Normal rate and regular rhythm.     Pulses: Normal pulses.     Heart sounds: Normal heart sounds. No murmur. No friction rub. No gallop.   Pulmonary:     Effort: Pulmonary effort is normal. No respiratory distress.     Breath sounds: Normal breath sounds. No stridor. No wheezing, rhonchi or rales.  Chest:     Chest wall: No tenderness.  Musculoskeletal:        General: Normal range of motion.     Cervical back: Normal range of motion and  neck supple.     Comments: Point of concern over the head of his 5th metatarsal, no swelling or tenderness.   Skin:    General: Skin is warm and dry.     Capillary Refill: Capillary refill takes less than 2 seconds.     Coloration: Skin is not jaundiced or pale.     Findings: No bruising, erythema, lesion or rash.  Neurological:     General: No focal deficit present.     Mental Status: He is alert and oriented to person, place, and time. Mental status is at baseline.  Psychiatric:        Mood and Affect: Mood normal.        Behavior: Behavior normal.        Thought Content: Thought content normal.        Judgment: Judgment normal.     Results for orders placed or performed in visit on 12/27/19  CBC with Differential/Platelet  Result Value Ref Range   WBC 7.2 3.4 - 10.8 x10E3/uL   RBC 5.54 4.14 - 5.80 x10E6/uL   Hemoglobin 15.8 13.0 -  17.7 g/dL   Hematocrit 38.1 01.7 - 51.0 %   MCV 86 79 - 97 fL   MCH 28.5 26.6 - 33.0 pg   MCHC 33.1 31.5 - 35.7 g/dL   RDW 51.0 25.8 - 52.7 %   Platelets 295 150 - 450 x10E3/uL   Neutrophils 63 Not Estab. %   Lymphs 24 Not Estab. %   Monocytes 7 Not Estab. %   Eos 5 Not Estab. %   Basos 1 Not Estab. %   Neutrophils Absolute 4.5 1.4 - 7.0 x10E3/uL   Lymphocytes Absolute 1.7 0.7 - 3.1 x10E3/uL   Monocytes Absolute 0.5 0.1 - 0.9 x10E3/uL   EOS (ABSOLUTE) 0.4 0.0 - 0.4 x10E3/uL   Basophils Absolute 0.1 0.0 - 0.2 x10E3/uL   Immature Granulocytes 0 Not Estab. %   Immature Grans (Abs) 0.0 0.0 - 0.1 x10E3/uL  Comprehensive metabolic panel  Result Value Ref Range   Glucose 87 65 - 99 mg/dL   BUN 10 6 - 20 mg/dL   Creatinine, Ser 7.82 0.76 - 1.27 mg/dL   GFR calc non Af Amer 98 >59 mL/min/1.73   GFR calc Af Amer 114 >59 mL/min/1.73   BUN/Creatinine Ratio 10 9 - 20   Sodium 139 134 - 144 mmol/L   Potassium 4.7 3.5 - 5.2 mmol/L   Chloride 100 96 - 106 mmol/L   CO2 23 20 - 29 mmol/L   Calcium 9.9 8.7 - 10.2 mg/dL   Total Protein 7.8 6.0 - 8.5 g/dL   Albumin 5.1 4.1 - 5.2 g/dL   Globulin, Total 2.7 1.5 - 4.5 g/dL   Albumin/Globulin Ratio 1.9 1.2 - 2.2   Bilirubin Total 0.7 0.0 - 1.2 mg/dL   Alkaline Phosphatase 77 39 - 117 IU/L   AST 24 0 - 40 IU/L   ALT 29 0 - 44 IU/L  Lipid Panel w/o Chol/HDL Ratio  Result Value Ref Range   Cholesterol, Total 157 100 - 199 mg/dL   Triglycerides 75 0 - 149 mg/dL   HDL 57 >42 mg/dL   VLDL Cholesterol Cal 14 5 - 40 mg/dL   LDL Chol Calc (NIH) 86 0 - 99 mg/dL  Microalbumin, Urine Waived  Result Value Ref Range   Microalb, Ur Waived 30 (H) 0 - 19 mg/L   Creatinine, Urine Waived 200 10 - 300 mg/dL   Microalb/Creat Ratio <30 <30  mg/g  TSH  Result Value Ref Range   TSH 1.300 0.450 - 4.500 uIU/mL  UA/M w/rflx Culture, Routine   Specimen: Urine   URINE  Result Value Ref Range   Specific Gravity, UA 1.025 1.005 - 1.030   pH, UA 5.5 5.0 - 7.5    Color, UA Yellow Yellow   Appearance Ur Clear Clear   Leukocytes,UA Negative Negative   Protein,UA Negative Negative/Trace   Glucose, UA Negative Negative   Ketones, UA Negative Negative   RBC, UA Negative Negative   Bilirubin, UA Negative Negative   Urobilinogen, Ur 0.2 0.2 - 1.0 mg/dL   Nitrite, UA Negative Negative      Assessment & Plan:   Problem List Items Addressed This Visit    None    Visit Diagnoses    Instability of left ankle joint    -  Primary   Will start exercises. Let us know if not getting better or getting worse and we will refer to ortho. Call with any concerns.    Localized superficial swelling, mass, or lump       Reassured patient. This is a bone in his foot. No organized lump.        Follow up plan: Return if symptoms worsen or fail to improve.

## 2020-02-07 NOTE — Patient Instructions (Signed)
Chronic Ankle Instability Chronic ankle instability is a condition that makes the ankle weak and more likely to give way. The condition is common among athletes, especially those with prior ankle ligament injury. Ligaments are strong tissues that connect bones to each other. What are the causes?  This condition is caused by multiple ankle sprains that have not healed properly, leaving the ankle ligaments loose or damaged. What increases the risk? This condition is more likely to develop in people who participate in sports in which there is a risk of spraining an ankle. These sports include:  Cross-country trail running.  Basketball.  Baseball.  Tennis.  Football.  Soccer. What are the signs or symptoms? Symptoms of this condition include:  Rolling your ankle repeatedly.  Swelling.  Pain.  Bruising.  Tenderness.  Feeling wobbly or unsteady on your foot.  Difficulty walking on uneven surfaces or in the dark. How is this diagnosed? This condition may be diagnosed based on:  Your symptoms.  Your medical history.  A physical exam. Your health care provider will check your balance, strength, and range of motion. He or she will also check your injured ankle against your healthy ankle.  Imaging tests, such as: ? An X-ray. ? A CT scan. ? An MRI. ? An ultrasound. How is this treated? Treatment for this condition may include:  Wearing a removable boot, brace, or splint.  Wearing supportive shoes or shoe inserts.  Applying ice to the ankle to reduce swelling.  Taking anti-inflammatory pain medicine.  Doing exercises (physical therapy).  Not putting any body weight, or putting only limited body weight, on your ankle for several days.  Gradually returning to full activity.  Surgery to repair damaged ligaments. Usually, surgery is needed only if the condition is severe or if other treatments do not work. Follow these instructions at home: If you have a boot,  brace, or splint:  Wear it as told by your health care provider. Remove it only as told by your health care provider.  Loosen it if your toes tingle, become numb, or turn cold and blue.  Keep it clean.  If it is not waterproof: ? Do not let it get wet. ? Cover it with a watertight covering when you take a bath or a shower.  Ask your health care provider when it is safe to drive with a boot, brace, or splint on your foot. Managing pain, stiffness, and swelling   If directed, put ice on the injured area. ? If you have a removable boot, brace, or splint, remove it as told by your health care provider. ? Put ice in a plastic bag. ? Place a towel between your skin and the bag. ? Leave the ice on for 20 minutes, 2-3 times a day.  Move your toes, foot, and ankle often to reduce stiffness and swelling.  Raise (elevate) the injured area above the level of your heart while you are sitting or lying down. Activity  Return to your normal activities as told by your health care provider. Ask your health care provider what activities are safe for you.  Do not put your full body weight on your ankle until your health care provider says that you can.  Do not do any activities that make pain or swelling worse.  Do exercises as told by your health care provider. General instructions  Take over-the-counter and prescription medicines only as told by your health care provider.  Wear supportive shoes or inserts as told by your  health care provider.  Keep all follow-up visits as told by your health care provider. This is important. How is this prevented?  Wear supportive footwear that is appropriate for your athletic activity.  Avoid athletic activities that cause pain or swelling in your ankle.  See your health care provider if you have an ankle sprain that causes pain and swelling for more than 2-4 weeks.  Do ankle range-of-motion and strengthening exercises as told by your health care  provider before beginning any athletic activity.  If you start a new athletic activity, start gradually to build up your strength and flexibility. Contact a health care provider if:  Your condition is not getting better after 2-4 weeks of treatment.  You cannot put body weight on your ankle without feeling more pain. Summary  Chronic ankle instability is a condition that makes the ankle weak and more likely to give way.  This condition is caused by multiple ankle sprains that have not healed properly, leaving the ankle ligaments loose or damaged.  Treatment includes wearing a boot, brace, or splint, taking medicines for pain and inflammation, and using ice on the affected area.  Follow your health care provider's instructions for caring for your ankle during recovery.  Contact your health care provider if your ankle does not get better in 2-4 weeks, or if you cannot put weight on your ankle without feeling more pain. This information is not intended to replace advice given to you by your health care provider. Make sure you discuss any questions you have with your health care provider. Document Revised: 07/06/2018 Document Reviewed: 07/06/2018 Elsevier Patient Education  [place].  Chronic Ankle Instability Rehab Ask your health care provider which exercises are safe for you. Do exercises exactly as told by your health care provider and adjust them as directed. It is normal to feel mild stretching, pulling, tightness, or discomfort as you do these exercises. Stop right away if you feel sudden pain or your pain gets worse. Do not begin these exercises until told by your health care provider. Strengthening exercises These exercises build strength and endurance in your ankle. Endurance is the ability to use your muscles for a long time, even after they get tired. Ankle eversion 1. Sit on the floor with your legs straight out in front of you. 2. Loop a rubber exercise band around  the ball of your left / right foot. The ball of your foot is on the walking surface, right under your toes. Hold the ends of the band in your hands, or secure the band to a stable object. 3. Slowly push your foot outward, away from your other leg (eversion). 4. Hold this position for __________ seconds. 5. Slowly return your foot to the starting position. Repeat __________ times. Complete this exercise __________ times a day. Heel walking  This exercise strengthens the muscles that push the ankle backward to point your toes toward your knee (ankle dorsiflexors). Walk on your heels for __________. Keep your toes as high as possible. Repeat __________ times. Complete this exercise __________ times per day. Toe walking  This exercise strengthens the muscles that push the ankle forward and your toes downward (ankle plantar flexors). Walk on your toes for __________. Keep your heels as high as possible. Repeat __________ times. Complete this exercise __________ times per day. Balance exercises These exercises improve or maintain your balance. Balance is important in improving ankle stability and preventing falls. Tandem walking Do this exercise in a hallway or room  that is at least 10 ft (3 m) long. 1. Stand with one foot directly in front of the other (tandem). You can use the walls to help you balance if needed, but try not to use them for support. 2. Slowly lift your back foot and place it directly in front of your other foot. 3. Continue to walk in this heel-to-toe way for __________. Repeat __________ times. Complete this exercise __________ times a day. Single leg stand 1. Without shoes, stand near a railing or in a doorway. You may hold on to the railing or door frame as needed for balance. 2. Stand on your left / right foot. Keep your big toe down on the floor and try to keep your arch lifted. If this is too easy, you can try doing it while you do one of these actions: ? Keep your eyes  closed. ? Stand on a pillow. ? Throw a ball against a wall and catch it when it returns. 3. Hold this position for __________ seconds. Repeat __________ times. Complete this exercise __________ times a day. Ankle inversion and ankle eversion This exercise is also called foot rotation with a balance board. It uses a balance board to rotate the foot and ankle inward (inversion) and outward (eversion). Ask your health care provider where you can get a balance board or how you can make one. 1. Stand on a non-carpeted surface near a countertop or wall. 2. Step onto the balance board so your feet are hip width apart. 3. Keep your feet in place, and keep your upper body and hips steady. 4. Using only your feet and ankles to move the board, do the following exercises: ? Tip the board from side to side as far as you can, alternating between tipping to the left and tipping to the right. ? Tip the board so it silently taps the floor. Do not let the board forcefully hit the floor. ? From time to time, pause to hold a steady midway position, with neither the right nor the left sides touching the ground. ? Tip the board side to side so the board does not hit the floor at all. From time to time, pause to hold a steady midway position. Repeat __________ times, pausing from time to time to hold a steady position. Complete this exercise __________ times a day. Ankle plantar flexion and ankle dorsiflexion This exercise is also called foot flexion with a balance board. It uses a balance board to push the foot downward and away from the leg (plantar flexion) or upward and toward the leg (dorsiflexion). Ask your health care provider where you can get a balance board or how you can make one. 1. Stand on a non-carpeted surface near a countertop or wall. 2. Step onto the balance board so your feet are hip width apart. 3. Keep your feet in place, and keep your upper body and hips steady. 4. Using only your feet and  ankles, do the following exercises: ? Tip the board forward and backward so the board silently taps the floor. Do not let the board forcefully hit the floor. ? From time to time, pause to hold a steady position midway between touching the floor in front and touching the floor in back. ? Tip the board forward and backward so the board does not hit the floor at all. From time to time, pause to hold a steady position. Repeat __________ times, pausing from time to time to hold a steady position. Complete this  exercise __________ times a day. This information is not intended to replace advice given to you by your health care provider. Make sure you discuss any questions you have with your health care provider. Document Revised: 01/13/2019 Document Reviewed: 07/06/2018 Elsevier Patient Education  2020 [employer].

## 2020-04-03 ENCOUNTER — Encounter: Payer: Self-pay | Admitting: Family Medicine

## 2020-04-03 ENCOUNTER — Other Ambulatory Visit: Payer: Self-pay

## 2020-04-03 ENCOUNTER — Ambulatory Visit: Payer: BC Managed Care – PPO | Admitting: Family Medicine

## 2020-04-03 ENCOUNTER — Encounter: Payer: Self-pay | Admitting: Podiatry

## 2020-04-03 ENCOUNTER — Ambulatory Visit: Payer: BC Managed Care – PPO | Admitting: Podiatry

## 2020-04-03 ENCOUNTER — Ambulatory Visit (INDEPENDENT_AMBULATORY_CARE_PROVIDER_SITE_OTHER): Payer: BC Managed Care – PPO

## 2020-04-03 VITALS — BP 138/85 | HR 76 | Temp 99.1°F | Wt 241.6 lb

## 2020-04-03 DIAGNOSIS — M79672 Pain in left foot: Secondary | ICD-10-CM

## 2020-04-03 DIAGNOSIS — R2242 Localized swelling, mass and lump, left lower limb: Secondary | ICD-10-CM | POA: Diagnosis not present

## 2020-04-03 DIAGNOSIS — M67472 Ganglion, left ankle and foot: Secondary | ICD-10-CM

## 2020-04-03 NOTE — Progress Notes (Signed)
BP 138/85 (BP Location: Left Arm, Patient Position: Sitting, Cuff Size: Normal)   Pulse 76   Temp 99.1 F (37.3 C) (Oral)   Wt 241 lb 9.6 oz (109.6 kg)   SpO2 99%   BMI 33.45 kg/m    Subjective:    Patient ID: Daniel Martinez, male    DOB: [birth year], 29 y.o.   MRN: [id number]  HPI: Daniel Martinez is a 29 y.o. male  Chief Complaint  Patient presents with  . Cyst    on foot. has grown since 02/07/2020 appt.    MASS Duration: chronic- significantly worse over the past 2 months Location: lateral side of L foot Onset: unknown Painful: no Discomfort: no Status:  bigger Trauma: no Redness: no Bruising: no Recent infection: no Swollen lymph nodes: no Requesting removal: yes History of cancer: no Family history of cancer: no History of the same: no  Relevant past medical, surgical, family and social history reviewed and updated as indicated. Interim medical history since our last visit reviewed. Allergies and medications reviewed and updated.  Review of Systems  Constitutional: Negative.   Respiratory: Negative.   Cardiovascular: Negative.   Gastrointestinal: Negative.   Musculoskeletal: Positive for arthralgias. Negative for back pain, gait problem, joint swelling, myalgias, neck pain and neck stiffness.  Skin: Negative.   Psychiatric/Behavioral: Negative.     Per HPI unless specifically indicated above     Objective:    BP 138/85 (BP Location: Left Arm, Patient Position: Sitting, Cuff Size: Normal)   Pulse 76   Temp 99.1 F (37.3 C) (Oral)   Wt 241 lb 9.6 oz (109.6 kg)   SpO2 99%   BMI 33.45 kg/m   Wt Readings from Last 3 Encounters:  04/03/20 241 lb 9.6 oz (109.6 kg)  02/07/20 245 lb 6.4 oz (111.3 kg)  12/27/19 237 lb 4 oz (107.6 kg)    Physical Exam Vitals and nursing note reviewed.  Constitutional:      General: He is not in acute distress.    Appearance: Normal appearance. He is not ill-appearing, toxic-appearing or diaphoretic.  HENT:      Head: Normocephalic and atraumatic.     Right Ear: External ear normal.     Left Ear: External ear normal.     Nose: Nose normal.     Mouth/Throat:     Mouth: Mucous membranes are moist.     Pharynx: Oropharynx is clear.  Eyes:     General: No scleral icterus.       Right eye: No discharge.        Left eye: No discharge.     Extraocular Movements: Extraocular movements intact.     Conjunctiva/sclera: Conjunctivae normal.     Pupils: Pupils are equal, round, and reactive to light.  Cardiovascular:     Rate and Rhythm: Normal rate and regular rhythm.     Pulses: Normal pulses.     Heart sounds: Normal heart sounds. No murmur heard.  No friction rub. No gallop.   Pulmonary:     Effort: Pulmonary effort is normal. No respiratory distress.     Breath sounds: Normal breath sounds. No stridor. No wheezing, rhonchi or rales.  Chest:     Chest wall: No tenderness.  Musculoskeletal:        General: Normal range of motion.     Cervical back: Normal range of motion and neck supple.     Comments: Hard 1.5inx 0.75in x 075in mass on lateral side of L foot  Skin:    General: Skin is warm and dry.     Capillary Refill: Capillary refill takes less than 2 seconds.     Coloration: Skin is not jaundiced or pale.     Findings: No bruising, erythema, lesion or rash.  Neurological:     General: No focal deficit present.     Mental Status: He is alert and oriented to person, place, and time. Mental status is at baseline.  Psychiatric:        Mood and Affect: Mood normal.        Behavior: Behavior normal.        Thought Content: Thought content normal.        Judgment: Judgment normal.     Results for orders placed or performed in visit on 12/27/19  CBC with Differential/Platelet  Result Value Ref Range   WBC 7.2 3.4 - 10.8 x10E3/uL   RBC 5.54 4.14 - 5.80 x10E6/uL   Hemoglobin 15.8 13.0 - 17.7 g/dL   Hematocrit 46.2 70.3 - 51.0 %   MCV 86 79 - 97 fL   MCH 28.5 26.6 - 33.0 pg   MCHC 33.1 31  - 35 g/dL   RDW 50.0 93.8 - 18.2 %   Platelets 295 150 - 450 x10E3/uL   Neutrophils 63 Not Estab. %   Lymphs 24 Not Estab. %   Monocytes 7 Not Estab. %   Eos 5 Not Estab. %   Basos 1 Not Estab. %   Neutrophils Absolute 4.5 1 - 7 x10E3/uL   Lymphocytes Absolute 1.7 0 - 3 x10E3/uL   Monocytes Absolute 0.5 0 - 0 x10E3/uL   EOS (ABSOLUTE) 0.4 0.0 - 0.4 x10E3/uL   Basophils Absolute 0.1 0 - 0 x10E3/uL   Immature Granulocytes 0 Not Estab. %   Immature Grans (Abs) 0.0 0.0 - 0.1 x10E3/uL  Comprehensive metabolic panel  Result Value Ref Range   Glucose 87 65 - 99 mg/dL   BUN 10 6 - 20 mg/dL   Creatinine, Ser 9.93 0.76 - 1.27 mg/dL   GFR calc non Af Amer 98 >59 mL/min/1.73   GFR calc Af Amer 114 >59 mL/min/1.73   BUN/Creatinine Ratio 10 9 - 20   Sodium 139 134 - 144 mmol/L   Potassium 4.7 3.5 - 5.2 mmol/L   Chloride 100 96 - 106 mmol/L   CO2 23 20 - 29 mmol/L   Calcium 9.9 8.7 - 10.2 mg/dL   Total Protein 7.8 6.0 - 8.5 g/dL   Albumin 5.1 4.1 - 5.2 g/dL   Globulin, Total 2.7 1.5 - 4.5 g/dL   Albumin/Globulin Ratio 1.9 1.2 - 2.2   Bilirubin Total 0.7 0.0 - 1.2 mg/dL   Alkaline Phosphatase 77 39 - 117 IU/L   AST 24 0 - 40 IU/L   ALT 29 0 - 44 IU/L  Lipid Panel w/o Chol/HDL Ratio  Result Value Ref Range   Cholesterol, Total 157 100 - 199 mg/dL   Triglycerides 75 0 - 149 mg/dL   HDL 57 >71 mg/dL   VLDL Cholesterol Cal 14 5 - 40 mg/dL   LDL Chol Calc (NIH) 86 0 - 99 mg/dL  Microalbumin, Urine Waived  Result Value Ref Range   Microalb, Ur Waived 30 (H) 0 - 19 mg/L   Creatinine, Urine Waived 200 10 - 300 mg/dL   Microalb/Creat Ratio <30 <30 mg/g  TSH  Result Value Ref Range   TSH 1.300 0.450 - 4.500 uIU/mL  UA/M w/rflx Culture, Routine  Specimen: Urine   URINE  Result Value Ref Range   Specific Gravity, UA 1.025 1.005 - 1.030   pH, UA 5.5 5.0 - 7.5   Color, UA Yellow Yellow   Appearance Ur Clear Clear   Leukocytes,UA Negative Negative   Protein,UA Negative Negative/Trace    Glucose, UA Negative Negative   Ketones, UA Negative Negative   RBC, UA Negative Negative   Bilirubin, UA Negative Negative   Urobilinogen, Ur 0.2 0.2 - 1.0 mg/dL   Nitrite, UA Negative Negative      Assessment & Plan:   Problem List Items Addressed This Visit    None    Visit Diagnoses    Mass of left foot    -  Primary   Will get him into podiatry ASAP. Referral generated today. Await their input.    Relevant Orders   Ambulatory referral to Podiatry       Follow up plan: Return if symptoms worsen or fail to improve.

## 2020-04-03 NOTE — Progress Notes (Signed)
   HPI: 29 y.o. male presenting today as a new patient for evaluation of a lump that developed over the past month to the patient's left foot.  Patient has noticed the lump increased in size over the past month.  He believes it is possibly a cyst.  He denies any trauma to the area.  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   No past medical history on file.   Physical Exam: General: The patient is alert and oriented x3 in no acute distress.  Dermatology: Skin is warm, dry and supple bilateral lower extremities. Negative for open lesions or macerations.  Vascular: Palpable pedal pulses bilaterally. No edema or erythema noted. Capillary refill within normal limits.  Neurological: Epicritic and protective threshold grossly intact bilaterally.   Musculoskeletal Exam: Range of motion within normal limits to all pedal and ankle joints bilateral. Muscle strength 5/5 in all groups bilateral.  Palpable, fluctuant, nonadhered ganglion cyst noted to the dorsal lateral aspect of the left midfoot  Radiographic Exam:  Normal osseous mineralization. Joint spaces preserved. No fracture/dislocation/boney destruction.    Assessment: 1.  Ganglion cyst left foot   Plan of Care:  1. Patient evaluated. X-Rays reviewed.  2.  Acute pressure was applied to the ganglion cyst and the cyst was ruptured and the fluid was resorbed into the surrounding tissues of the foot.  Injection of 0.5 cc Celestone Soluspan injected along the extensor tendon sheath to reduce the chance of recurrence 3.  Compression anklet dispensed.  Wear daily x1 month 4.  Recommend daily massage to the area to prevent recurrence 5.  Return to clinic as needed      Felecia Shelling, DPM [facility]  Dr. Felecia Shelling, DPM    [address] [place], [place] [address]                Office [phone]  Fax [phone]

## 2020-04-10 ENCOUNTER — Ambulatory Visit: Payer: Self-pay | Admitting: Podiatry

## 2020-04-26 ENCOUNTER — Other Ambulatory Visit: Payer: Self-pay | Admitting: Podiatry

## 2020-04-26 DIAGNOSIS — M67472 Ganglion, left ankle and foot: Secondary | ICD-10-CM

## 2020-06-29 ENCOUNTER — Ambulatory Visit: Payer: BC Managed Care – PPO | Admitting: Family Medicine

## 2020-07-04 ENCOUNTER — Other Ambulatory Visit: Payer: Self-pay | Admitting: Family Medicine

## 2020-07-04 NOTE — Telephone Encounter (Signed)
Requested Prescriptions  Pending Prescriptions Disp Refills  . lisinopril (ZESTRIL) 20 MG tablet [Pharmacy Med Name: LISINOPRIL 20MG  TABLETS] 90 tablet 0    Sig: TAKE 1 TABLET(20 MG) BY MOUTH DAILY     Cardiovascular:  ACE Inhibitors Failed - 07/04/2020  3:39 AM      Failed - Cr in normal range and within 180 days    Creatinine, Ser  Date Value Ref Range Status  12/27/2019 1.03 0.76 - 1.27 mg/dL Final         Failed - K in normal range and within 180 days    Potassium  Date Value Ref Range Status  12/27/2019 4.7 3.5 - 5.2 mmol/L Final         Passed - Patient is not pregnant      Passed - Last BP in normal range    BP Readings from Last 1 Encounters:  04/03/20 138/85         Passed - Valid encounter within last 6 months    Recent Outpatient Visits          3 months ago Mass of left foot   [facility] [place], Megan P, DO   4 months ago Instability of left ankle joint   [facility] [place], Megan P, DO   6 months ago Routine general medical examination at a health care facility   [facility] [place], [place], DO   1 year ago Essential hypertension   [facility] [place], [place], DO   1 year ago LLQ pain   [facility] [place], [place], DO      Future Appointments            In 1 week [place], Laural Benes, DO Oral[facility] Rud, [facility]

## 2020-07-17 ENCOUNTER — Encounter: Payer: Self-pay | Admitting: Family Medicine

## 2020-07-17 ENCOUNTER — Ambulatory Visit (INDEPENDENT_AMBULATORY_CARE_PROVIDER_SITE_OTHER): Payer: BC Managed Care – PPO | Admitting: Family Medicine

## 2020-07-17 ENCOUNTER — Other Ambulatory Visit: Payer: Self-pay

## 2020-07-17 VITALS — BP 126/81 | HR 71 | Temp 97.6°F | Ht 71.6 in | Wt 234.0 lb

## 2020-07-17 DIAGNOSIS — Z23 Encounter for immunization: Secondary | ICD-10-CM | POA: Diagnosis not present

## 2020-07-17 DIAGNOSIS — I10 Essential (primary) hypertension: Secondary | ICD-10-CM

## 2020-07-17 MED ORDER — LISINOPRIL 20 MG PO TABS: ORAL_TABLET | ORAL | 1 refills | Status: DC

## 2020-07-17 NOTE — Progress Notes (Addendum)
BP 126/81   Pulse 71   Temp 97.6 F (36.4 C) (Oral)   Ht 5' 11.6" (1.819 m)   Wt 234 lb (106.1 kg)   SpO2 100%   BMI 32.09 kg/m    Subjective:    Patient ID: Daniel Martinez, male    DOB: 1991-08-08, 29 y.o.   MRN: [id number]  HPI: Daniel Martinez is a 29 y.o. male  Chief Complaint  Patient presents with  . Hypertension    Follow up.   . Flu Vaccine   HYPERTENSION Hypertension status: controlled  Satisfied with current treatment? yes Duration of hypertension: chronic BP monitoring frequency:  not checking BP medication side effects:  no Medication compliance: excellent compliance Previous BP meds:lisinopril Aspirin: no Recurrent headaches: no Visual changes: no Palpitations: no Dyspnea: no Chest pain: no Lower extremity edema: no Dizzy/lightheaded: no  Relevant past medical, surgical, family and social history reviewed and updated as indicated. Interim medical history since our last visit reviewed. Allergies and medications reviewed and updated.  Review of Systems  Constitutional: Negative.   Respiratory: Negative.   Cardiovascular: Negative.   Gastrointestinal: Negative.   Musculoskeletal: Positive for arthralgias. Negative for back pain, gait problem, joint swelling, myalgias, neck pain and neck stiffness.  Skin: Negative.   Neurological: Negative.   Psychiatric/Behavioral: Negative.     Per HPI unless specifically indicated above     Objective:    BP 126/81   Pulse 71   Temp 97.6 F (36.4 C) (Oral)   Ht 5' 11.6" (1.819 m)   Wt 234 lb (106.1 kg)   SpO2 100%   BMI 32.09 kg/m   Wt Readings from Last 3 Encounters:  07/17/20 234 lb (106.1 kg)  04/03/20 241 lb 9.6 oz (109.6 kg)  02/07/20 245 lb 6.4 oz (111.3 kg)    Physical Exam Vitals and nursing note reviewed.  Constitutional:      General: He is not in acute distress.    Appearance: Normal appearance. He is not ill-appearing, toxic-appearing or diaphoretic.  HENT:     Head:  Normocephalic and atraumatic.     Right Ear: External ear normal.     Left Ear: External ear normal.     Nose: Nose normal.     Mouth/Throat:     Mouth: Mucous membranes are moist.     Pharynx: Oropharynx is clear.  Eyes:     General: No scleral icterus.       Right eye: No discharge.        Left eye: No discharge.     Extraocular Movements: Extraocular movements intact.     Conjunctiva/sclera: Conjunctivae normal.     Pupils: Pupils are equal, round, and reactive to light.  Cardiovascular:     Rate and Rhythm: Normal rate and regular rhythm.     Pulses: Normal pulses.     Heart sounds: Normal heart sounds. No murmur heard.  No friction rub. No gallop.   Pulmonary:     Effort: Pulmonary effort is normal. No respiratory distress.     Breath sounds: Normal breath sounds. No stridor. No wheezing, rhonchi or rales.  Chest:     Chest wall: No tenderness.  Musculoskeletal:        General: Normal range of motion.     Cervical back: Normal range of motion and neck supple.  Skin:    General: Skin is warm and dry.     Capillary Refill: Capillary refill takes less than 2 seconds.  Coloration: Skin is not jaundiced or pale.     Findings: No bruising, erythema, lesion or rash.  Neurological:     General: No focal deficit present.     Mental Status: He is alert and oriented to person, place, and time. Mental status is at baseline.  Psychiatric:        Mood and Affect: Mood normal.        Behavior: Behavior normal.        Thought Content: Thought content normal.        Judgment: Judgment normal.     Results for orders placed or performed in visit on 12/27/19  CBC with Differential/Platelet  Result Value Ref Range   WBC 7.2 3.4 - 10.8 x10E3/uL   RBC 5.54 4.14 - 5.80 x10E6/uL   Hemoglobin 15.8 13.0 - 17.7 g/dL   Hematocrit 90.3 00.9 - 51.0 %   MCV 86 79 - 97 fL   MCH 28.5 26.6 - 33.0 pg   MCHC 33.1 31 - 35 g/dL   RDW 23.3 00.7 - 62.2 %   Platelets 295 150 - 450 x10E3/uL    Neutrophils 63 Not Estab. %   Lymphs 24 Not Estab. %   Monocytes 7 Not Estab. %   Eos 5 Not Estab. %   Basos 1 Not Estab. %   Neutrophils Absolute 4.5 1 - 7 x10E3/uL   Lymphocytes Absolute 1.7 0 - 3 x10E3/uL   Monocytes Absolute 0.5 0 - 0 x10E3/uL   EOS (ABSOLUTE) 0.4 0.0 - 0.4 x10E3/uL   Basophils Absolute 0.1 0 - 0 x10E3/uL   Immature Granulocytes 0 Not Estab. %   Immature Grans (Abs) 0.0 0.0 - 0.1 x10E3/uL  Comprehensive metabolic panel  Result Value Ref Range   Glucose 87 65 - 99 mg/dL   BUN 10 6 - 20 mg/dL   Creatinine, Ser 6.33 0.76 - 1.27 mg/dL   GFR calc non Af Amer 98 >59 mL/min/1.73   GFR calc Af Amer 114 >59 mL/min/1.73   BUN/Creatinine Ratio 10 9 - 20   Sodium 139 134 - 144 mmol/L   Potassium 4.7 3.5 - 5.2 mmol/L   Chloride 100 96 - 106 mmol/L   CO2 23 20 - 29 mmol/L   Calcium 9.9 8.7 - 10.2 mg/dL   Total Protein 7.8 6.0 - 8.5 g/dL   Albumin 5.1 4.1 - 5.2 g/dL   Globulin, Total 2.7 1.5 - 4.5 g/dL   Albumin/Globulin Ratio 1.9 1.2 - 2.2   Bilirubin Total 0.7 0.0 - 1.2 mg/dL   Alkaline Phosphatase 77 39 - 117 IU/L   AST 24 0 - 40 IU/L   ALT 29 0 - 44 IU/L  Lipid Panel w/o Chol/HDL Ratio  Result Value Ref Range   Cholesterol, Total 157 100 - 199 mg/dL   Triglycerides 75 0 - 149 mg/dL   HDL 57 >35 mg/dL   VLDL Cholesterol Cal 14 5 - 40 mg/dL   LDL Chol Calc (NIH) 86 0 - 99 mg/dL  Microalbumin, Urine Waived  Result Value Ref Range   Microalb, Ur Waived 30 (H) 0 - 19 mg/L   Creatinine, Urine Waived 200 10 - 300 mg/dL   Microalb/Creat Ratio <30 <30 mg/g  TSH  Result Value Ref Range   TSH 1.300 0.450 - 4.500 uIU/mL  UA/M w/rflx Culture, Routine   Specimen: Urine   URINE  Result Value Ref Range   Specific Gravity, UA 1.025 1.005 - 1.030   pH, UA 5.5 5.0 - 7.5  Color, UA Yellow Yellow   Appearance Ur Clear Clear   Leukocytes,UA Negative Negative   Protein,UA Negative Negative/Trace   Glucose, UA Negative Negative   Ketones, UA Negative Negative   RBC, UA  Negative Negative   Bilirubin, UA Negative Negative   Urobilinogen, Ur 0.2 0.2 - 1.0 mg/dL   Nitrite, UA Negative Negative      Assessment & Plan:   Problem List Items Addressed This Visit      Cardiovascular and Mediastinum   Essential hypertension - Primary    Under good control on current regimen. Continue current regimen. Continue to monitor. Call with any concerns. Refills given. Labs drawn today.       Relevant Medications   lisinopril (ZESTRIL) 20 MG tablet   Other Relevant Orders   Basic metabolic panel    Other Visit Diagnoses    Need for immunization against influenza       Flu shot given today.   Relevant Orders   Flu Vaccine QUAD 36+ mos IM (Completed)       Follow up plan: Return in about 6 months (around 01/15/2021) for physical.

## 2020-07-17 NOTE — Patient Instructions (Signed)
Voltaren Gel

## 2020-07-17 NOTE — Assessment & Plan Note (Signed)
Under good control on current regimen. Continue current regimen. Continue to monitor. Call with any concerns. Refills given. Labs drawn today.   

## 2020-07-18 LAB — BASIC METABOLIC PANEL
BUN/Creatinine Ratio: 7 — ABNORMAL LOW (ref 9–20)
BUN: 7 mg/dL (ref 6–20)
CO2: 23 mmol/L (ref 20–29)
Calcium: 9.7 mg/dL (ref 8.7–10.2)
Chloride: 95 mmol/L — ABNORMAL LOW (ref 96–106)
Creatinine, Ser: 0.99 mg/dL (ref 0.76–1.27)
GFR calc Af Amer: 118 mL/min/{1.73_m2} (ref >59)
GFR calc non Af Amer: 102 mL/min/{1.73_m2} (ref >59)
Sodium: 139 mmol/L (ref 134–144)

## 2020-07-31 ENCOUNTER — Ambulatory Visit: Payer: Self-pay

## 2020-07-31 NOTE — Telephone Encounter (Signed)
Noted  

## 2020-07-31 NOTE — Telephone Encounter (Signed)
Patient called stating that since yesterday he has had pain and mild swelling of his left testicle.  He states that he does not know of any injury He rates the swelling as mils pain at 6.  He states that he sometimes has difficulty urinating and that this symptom was before he noticed pain and swelling.  Per protocol patient will go to ER for evaluation. Care advice read to patient.  He verbalized understanding.   Reason for Disposition . Scrotum is painful or tender to touch  Answer Assessment - Initial Assessment Questions 1. SCROTAL SWELLING: "What does the scrotum look like?" "How swollen is it?" (mild, moderate severe; compare to other side)     Left side mild 2. LOCATION: "Where is the swelling located?"     Left side 3. ONSET: "When did the swelling start?"     yesterday 4. PATTERN: "Does it come and go, or has it been constant since it started?"     constant 5. SCROTAL PAIN: "Is there any pain?" If Yes, ask: "How bad is it?"  (Scale 1-10; or mild, moderate, severe)     6 6. HERNIA: "Has a doctor ever told you that you have a hernia?"     no 7. OTHER SYMPTOMS: "Do you have any other symptoms?" (e.g., fever, abdominal pain, vomiting, difficulty passing urine)     Difficult passing urine  Protocols used: SCROTUM SWELLING-A-AH

## 2020-07-31 NOTE — Telephone Encounter (Signed)
Called pt scheduled him for tomorrow to see [facility]

## 2020-08-01 ENCOUNTER — Ambulatory Visit
Admission: RE | Admit: 2020-08-01 | Discharge: 2020-08-01 | Disposition: A | Discharge status: Home / Self Care | Payer: BC Managed Care – PPO | Source: Ambulatory Visit | Attending: Nurse Practitioner | Admitting: Nurse Practitioner

## 2020-08-01 ENCOUNTER — Encounter: Payer: Self-pay | Admitting: Nurse Practitioner

## 2020-08-01 ENCOUNTER — Other Ambulatory Visit: Payer: Self-pay

## 2020-08-01 ENCOUNTER — Ambulatory Visit: Payer: BC Managed Care – PPO | Admitting: Nurse Practitioner

## 2020-08-01 ENCOUNTER — Telehealth: Payer: Self-pay | Admitting: Family Medicine

## 2020-08-01 ENCOUNTER — Telehealth: Payer: Self-pay | Admitting: Nurse Practitioner

## 2020-08-01 VITALS — BP 128/68 | HR 79 | Temp 98.4°F | Resp 16 | Ht 72.0 in | Wt 239.0 lb

## 2020-08-01 DIAGNOSIS — N5089 Other specified disorders of the male genital organs: Secondary | ICD-10-CM | POA: Insufficient documentation

## 2020-08-01 DIAGNOSIS — N433 Hydrocele, unspecified: Secondary | ICD-10-CM | POA: Diagnosis not present

## 2020-08-01 LAB — UA/M W/RFLX CULTURE, ROUTINE
Bilirubin, UA: NEGATIVE
Glucose, UA: NEGATIVE
Ketones, UA: NEGATIVE
Leukocytes,UA: NEGATIVE
Nitrite, UA: NEGATIVE
Protein,UA: NEGATIVE
RBC, UA: NEGATIVE
Specific Gravity, UA: 1.02 (ref 1.005–1.030)
Urobilinogen, Ur: 0.2 mg/dL (ref 0.2–1.0)
pH, UA: 6 (ref 5.0–7.5)

## 2020-08-01 MED ORDER — CEFTRIAXONE SODIUM 500 MG IJ SOLR
500.0000 mg | Freq: Once | INTRAMUSCULAR | Status: AC
  Administered 2020-08-01: 500 mg via INTRAMUSCULAR

## 2020-08-01 MED ORDER — DOXYCYCLINE HYCLATE 100 MG PO TABS: 100.0000 mg | ORAL_TABLET | Freq: Two times a day (BID) | ORAL | 0 refills | Status: DC

## 2020-08-01 NOTE — Patient Instructions (Signed)
° °Inguinal Hernia, Adult °An inguinal hernia is when fat or your intestines push through a weak spot in a muscle where your leg meets your lower belly (groin). This causes a rounded lump (bulge). This kind of hernia could also be: °· In your scrotum, if you are male. °· In folds of skin around your vagina, if you are male. °There are three types of inguinal hernias. These include: °· Hernias that can be pushed back into the belly (are reducible). This type rarely causes pain. °· Hernias that cannot be pushed back into the belly (are incarcerated). °· Hernias that cannot be pushed back into the belly and lose their blood supply (are strangulated). This type needs emergency surgery. °If you do not have symptoms, you may not need treatment. If you have symptoms or a large hernia, you may need surgery. °Follow these instructions at home: °Lifestyle °· Do these things if told by your doctor so you do not have trouble pooping (constipation): °? Drink enough fluid to keep your pee (urine) pale yellow. °? Eat foods that have a lot of fiber. These include fresh fruits and vegetables, whole grains, and beans. °? Limit foods that are high in fat and processed sugars. These include foods that are fried or sweet. °? Take medicine for trouble pooping. °· Avoid lifting heavy objects. °· Avoid standing for long amounts of time. °· Do not use any products that contain nicotine or tobacco. These include cigarettes and e-cigarettes. If you need help quitting, ask your doctor. °· Stay at a healthy weight. °General instructions °· You may try to push your hernia in by very gently pressing on it when you are lying down. Do not try to force the bulge back in if it will not push in easily. °· Watch your hernia for any changes in shape, size, or color. Tell your doctor if you see any changes. °· Take over-the-counter and prescription medicines only as told by your doctor. °· Keep all follow-up visits as told by your doctor. This is  important. °Contact a doctor if: °· You have a fever. °· You have new symptoms. °· Your symptoms get worse. °Get help right away if: °· The area where your leg meets your lower belly has: °? Pain that gets worse suddenly. °? A bulge that gets bigger suddenly, and it does not get smaller after that. °? A bulge that turns red or purple. °? A bulge that is painful when you touch it. °· You are a man, and your scrotum: °? Suddenly feels painful. °? Suddenly changes in size. °· You cannot push the hernia in by very gently pressing on it when you are lying down. Do not try to force the bulge back in if it will not push in easily. °· You feel sick to your stomach (nauseous), and that feeling does not go away. °· You throw up (vomit), and that keeps happening. °· You have a fast heartbeat. °· You cannot poop (have a bowel movement) or pass gas. °These symptoms may be an emergency. Do not wait to see if the symptoms will go away. Get medical help right away. Call your local emergency services (911 in the [place] °Summary °· An inguinal hernia is when fat or your intestines push through a weak spot in a muscle where your leg meets your lower belly (groin). This causes a rounded lump (bulge). °· If you do not have symptoms, you may not need treatment. If you have symptoms or a large hernia,   you may need surgery. °· Avoid lifting heavy objects. Also avoid standing for long amounts of time. °· Do not try to force the bulge back in if it will not push in easily. °This information is not intended to replace advice given to you by your health care provider. Make sure you discuss any questions you have with your health care provider. °Document Revised: 10/24/2017 Document Reviewed: 06/24/2017 °Elsevier Patient Education © 2020 [employer] Inc. ° °

## 2020-08-01 NOTE — Progress Notes (Signed)
BP 128/68   Pulse 79   Temp 98.4 F (36.9 C) (Oral)   Resp 16   Ht 6' (1.829 m)   Wt 239 lb (108.4 kg)   SpO2 96%   BMI 32.41 kg/m    Subjective:    Patient ID: Daniel Martinez, male    DOB: 11/22/1990, 29 y.o.   MRN: [id number]  HPI: CONNIE HILGERT is a 29 y.o. male  Chief Complaint  Patient presents with  . Testicle Pain   SCROTAL SWELLING Notices it to left testicle, started yesterday.  Reports no major injuries.  A couple days ago did bump area on equipment, not hard, he does not suspect this caused.  Sometimes if sitting at work will cough and notices something bulging with some pressure -- tingling sensation.  Does endorse pain 3-4/10, dull and aching -- constant.   Dysuria: no Urinary frequency: no Urgency: no Small volume voids: no Symptom severity: no Urinary incontinence: no Foul odor: no Hematuria: no Abdominal pain: no Back pain: no Suprapubic pain/pressure: no Flank pain: no Fever:  no Vomiting: no Sexual activity: No sexually active -- 3 months or more ago History of sexually transmitted disease: no Penile discharge: no  Relevant past medical, surgical, family and social history reviewed and updated as indicated. Interim medical history since our last visit reviewed. Allergies and medications reviewed and updated.  Review of Systems  Constitutional: Negative for activity change, diaphoresis, fatigue and fever.  Respiratory: Negative for cough, chest tightness, shortness of breath and wheezing.   Cardiovascular: Negative for chest pain, palpitations and leg swelling.  Genitourinary: Positive for scrotal swelling and testicular pain.  Musculoskeletal: Negative.   Skin: Negative.   Neurological: Negative.   Psychiatric/Behavioral: Negative.     Per HPI unless specifically indicated above     Objective:    BP 128/68   Pulse 79   Temp 98.4 F (36.9 C) (Oral)   Resp 16   Ht 6' (1.829 m)   Wt 239 lb (108.4 kg)   SpO2 96%   BMI 32.41  kg/m   Wt Readings from Last 3 Encounters:  08/01/20 239 lb (108.4 kg)  07/17/20 234 lb (106.1 kg)  04/03/20 241 lb 9.6 oz (109.6 kg)    Physical Exam Vitals and nursing note reviewed.  Constitutional:      General: He is awake. He is not in acute distress.    Appearance: He is well-developed and well-groomed. He is obese. He is not ill-appearing.  HENT:     Head: Normocephalic and atraumatic.     Right Ear: Hearing normal. No drainage.     Left Ear: Hearing normal. No drainage.  Eyes:     General: Lids are normal.        Right eye: No discharge.        Left eye: No discharge.     Conjunctiva/sclera: Conjunctivae normal.     Pupils: Pupils are equal, round, and reactive to light.  Neck:     Trachea: Trachea normal.  Cardiovascular:     Rate and Rhythm: Normal rate and regular rhythm.     Heart sounds: Normal heart sounds, S1 normal and S2 normal. No murmur heard.  No gallop.   Pulmonary:     Effort: Pulmonary effort is normal. No accessory muscle usage or respiratory distress.     Breath sounds: Normal breath sounds.  Abdominal:     General: Bowel sounds are normal. There is no distension.  Palpations: Abdomen is soft.     Tenderness: There is no abdominal tenderness.     Hernia: There is no hernia in the left inguinal area or right inguinal area.  Genitourinary:    Penis: Normal.      Testes:        Right: Mass, tenderness or swelling not present.        Left: Swelling present. Mass or tenderness not present. Cremasteric reflex is present.      Epididymis:     Right: Not inflamed.     Left: Inflamed.     Comments: No hernia appreciated on exam.  Left scrotal enlarged with testicular swelling, no masses noted.  Slightly inflamed left epididymis.  No tenderness with exam. Musculoskeletal:        General: Normal range of motion.     Cervical back: Normal range of motion and neck supple.     Right lower leg: No edema.     Left lower leg: No edema.  Skin:    General:  Skin is warm and dry.     Capillary Refill: Capillary refill takes less than 2 seconds.     Findings: No rash.  Neurological:     Mental Status: He is alert and oriented to person, place, and time.  Psychiatric:        Mood and Affect: Mood normal.        Speech: Speech normal.        Behavior: Behavior normal. Behavior is cooperative.        Thought Content: Thought content normal.     Results for orders placed or performed in visit on 07/17/20  Basic metabolic panel  Result Value Ref Range   Glucose CANCELED mg/dL   BUN 7 6 - 20 mg/dL   Creatinine, Ser 9.37 0.76 - 1.27 mg/dL   GFR calc non Af Amer 102 >59 mL/min/1.73   GFR calc Af Amer 118 >59 mL/min/1.73   BUN/Creatinine Ratio 7 (L) 9 - 20   Sodium 139 134 - 144 mmol/L   Potassium CANCELED mmol/L   Chloride 95 (L) 96 - 106 mmol/L   CO2 23 20 - 29 mmol/L   Calcium 9.7 8.7 - 10.2 mg/dL      Assessment & Plan:   Problem List Items Addressed This Visit      Other   Testicular swelling - Primary    Acute x 24 hours.  ?hernia vs infection/inflammation vs hydrocele.  Will obtain stat imaging, U/S ordered of left scrotum.  Provide Rocephin dose in office today and script for Doxycycline 100 MG BID x 10 days sent.  Recommend he wear supportive underwear at this time, boxers present at visit today.  May use Tylenol as needed for discomfort.  Return to office in 2 weeks for follow-up, will review imaging upon return to determine next steps.      Relevant Medications   cefTRIAXone (ROCEPHIN) injection 500 mg (Start on 08/01/2020  1:45 PM)   Other Relevant Orders   UA/M w/rflx Culture, Routine   GC/Chlamydia Probe Amp   US SCROTUM W/DOPPLER       Follow up plan: Return in about 2 weeks (around 08/15/2020) for scrotal swelling.

## 2020-08-01 NOTE — Assessment & Plan Note (Signed)
Acute x 24 hours.  ?hernia vs infection/inflammation vs hydrocele.  Will obtain stat imaging, U/S ordered of left scrotum.  Provide Rocephin dose in office today and script for Doxycycline 100 MG BID x 10 days sent.  Recommend he wear supportive underwear at this time, boxers present at visit today.  May use Tylenol as needed for discomfort.  Return to office in 2 weeks for follow-up, will review imaging upon return to determine next steps.

## 2020-08-01 NOTE — Telephone Encounter (Signed)
Spoke to patient on phone and reviewed results with him.  Showing left hydrocele with prominent left epididymis, as noted on exam.  Was given abx in office today and recommend he start taking Doxycycline sent in, take until complete, discussed with him if infection improves then hydrocele should improve.  Recommend supportive underwear and he is to follow-up in two weeks.

## 2020-08-01 NOTE — Progress Notes (Signed)
Spoke to patient on phone and reviewed results with him.  Showing left hydrocele with prominent left epididymis, as noted on exam.  Was given abx in office today and recommend he start taking Doxycycline sent in, take until complete, discussed with him if infection improves then hydrocele should improve.  Recommend supportive underwear and he is to follow-up in two weeks. 

## 2020-08-01 NOTE — Telephone Encounter (Signed)
Call report from [place]. Result in Epic. Scrotal US. Called provider and spoke with [place] CMA. Per [place] to release patient.

## 2020-08-05 LAB — GC/CHLAMYDIA PROBE AMP
Chlamydia trachomatis, NAA: NEGATIVE
Neisseria Gonorrhoeae by PCR: NEGATIVE

## 2020-08-06 NOTE — Progress Notes (Signed)
Contacted via MyChart  Your STD testing returned negative!!!  [employer].  Hope you are starting to feel better.:) Keep being awesome!!  Thank you for allowing me to participate in your care. Kindest regards, Alexa Golebiewski

## 2020-10-08 ENCOUNTER — Ambulatory Visit: Payer: BC Managed Care – PPO | Admitting: Family Medicine

## 2020-10-22 ENCOUNTER — Ambulatory Visit: Payer: BC Managed Care – PPO | Admitting: Family Medicine

## 2020-10-23 ENCOUNTER — Ambulatory Visit: Payer: BC Managed Care – PPO | Admitting: Family Medicine

## 2020-10-25 ENCOUNTER — Other Ambulatory Visit: Payer: Self-pay

## 2020-11-13 ENCOUNTER — Ambulatory Visit: Payer: BC Managed Care – PPO | Admitting: Nurse Practitioner

## 2020-11-13 ENCOUNTER — Other Ambulatory Visit: Payer: Self-pay

## 2020-11-13 ENCOUNTER — Encounter: Payer: Self-pay | Admitting: Nurse Practitioner

## 2020-11-13 VITALS — BP 138/78 | HR 84 | Temp 98.3°F | Wt 253.2 lb

## 2020-11-13 DIAGNOSIS — N5089 Other specified disorders of the male genital organs: Secondary | ICD-10-CM

## 2020-11-13 NOTE — Progress Notes (Signed)
Acute Office Visit  Subjective:    Patient ID: Daniel Martinez, male    DOB: 11-18-1990, 30 y.o.   MRN: [id number]  Chief Complaint  Patient presents with  . epididymitis    Pt states he was diagnosed near the end of 10/21. Patient states there is still swelling in the testicles and uncomfortable.     HPI Patient is in today for swelling in his left testicle that never went away from epididymitis last October. Intermittent aching pain near swelling. Has tried ice and supportive underwear.   Dysuria: no Urinary frequency: yes, drinks a lot of water Urgency: no Small volume voids: no Symptom severity: mild Urinary incontinence: no Foul odor: no Hematuria: no Abdominal pain: no Back pain: no Suprapubic pain/pressure: no Flank pain: no Fever: no Vomiting: no Sexual activity: no History of sexually transmitted disease: no Penile discharge: no  Past Medical History:  Diagnosis Date  . Hypertension     Past Surgical History:  Procedure Laterality Date  . WISDOM TOOTH EXTRACTION Bilateral 2021    Family History  Problem Relation Age of Onset  . Diabetes Mother     Social History   Socioeconomic History  . Marital status: Single    Spouse name: Not on file  . Number of children: Not on file  . Years of education: Not on file  . Highest education level: Not on file  Occupational History  . Not on file  Tobacco Use  . Smoking status: Never Smoker  . Smokeless tobacco: Never Used  Vaping Use  . Vaping Use: Never used  Substance and Sexual Activity  . Alcohol use: No  . Drug use: No  . Sexual activity: Not Currently  Other Topics Concern  . Not on file  Social History Narrative  . Not on file   Social Determinants of Health   Financial Resource Strain: Not on file  Food Insecurity: Not on file  Transportation Needs: Not on file  Physical Activity: Not on file  Stress: Not on file  Social Connections: Not on file  Intimate Partner Violence: Not on  file    Outpatient Medications Prior to Visit  Medication Sig Dispense Refill  . lisinopril (ZESTRIL) 20 MG tablet TAKE 1 TABLET(20 MG) BY MOUTH DAILY 90 tablet 1  . doxycycline (VIBRA-TABS) 100 MG tablet Take 1 tablet (100 mg total) by mouth 2 (two) times daily. (Patient not taking: Reported on 11/13/2020) 20 tablet 0   No facility-administered medications prior to visit.    No Known Allergies  Review of Systems  Constitutional: Negative for fatigue and fever.  Respiratory: Negative.   Cardiovascular: Negative.   Gastrointestinal: Negative for abdominal distention, nausea and vomiting.  Genitourinary: Positive for frequency, scrotal swelling (left) and testicular pain (left intermittent). Negative for decreased urine volume, difficulty urinating, dysuria, flank pain, hematuria, penile discharge and urgency.  Musculoskeletal: Negative.        Objective:    Physical Exam Vitals and nursing note reviewed. Exam conducted with a chaperone present.  Constitutional:      General: He is not in acute distress.    Appearance: Normal appearance. He is not ill-appearing or toxic-appearing.  HENT:     Head: Normocephalic.  Cardiovascular:     Rate and Rhythm: Normal rate and regular rhythm.     Pulses: Normal pulses.     Heart sounds: Normal heart sounds.  Pulmonary:     Effort: Pulmonary effort is normal.     Breath sounds: Normal  breath sounds.  Abdominal:     Palpations: Abdomen is soft.     Tenderness: There is no abdominal tenderness.  Genitourinary:    Testes:        Right: Mass, tenderness or swelling not present.        Left: Swelling present. Tenderness not present.     Epididymis:     Right: Normal.     Left: Inflamed. No tenderness.  Musculoskeletal:     Cervical back: Normal range of motion.  Neurological:     Mental Status: He is alert.     BP 138/78 (BP Location: Left Arm, Patient Position: Sitting)   Pulse 84   Temp 98.3 F (36.8 C)   Wt 253 lb 3.2 oz (114.9  kg)   SpO2 98%   BMI 34.34 kg/m  Wt Readings from Last 3 Encounters:  11/13/20 253 lb 3.2 oz (114.9 kg)  08/01/20 239 lb (108.4 kg)  07/17/20 234 lb (106.1 kg)     Lab Results  Component Value Date   TSH 1.300 12/27/2019   Lab Results  Component Value Date   WBC 7.2 12/27/2019   HGB 15.8 12/27/2019   HCT 47.7 12/27/2019   MCV 86 12/27/2019   PLT 295 12/27/2019   Lab Results  Component Value Date   NA 139 07/17/2020   K CANCELED 07/17/2020   CO2 23 07/17/2020   GLUCOSE CANCELED 07/17/2020   BUN 7 07/17/2020   CREATININE 0.99 07/17/2020   BILITOT 0.7 12/27/2019   ALKPHOS 77 12/27/2019   AST 24 12/27/2019   ALT 29 12/27/2019   PROT 7.8 12/27/2019   ALBUMIN 5.1 12/27/2019   CALCIUM 9.7 07/17/2020   Lab Results  Component Value Date   CHOL 157 12/27/2019   Lab Results  Component Value Date   HDL 57 12/27/2019   Lab Results  Component Value Date   LDLCALC 86 12/27/2019   Lab Results  Component Value Date   TRIG 75 12/27/2019   No results found for: [facility] Lab Results  Component Value Date   HGBA1C 5.2 06/14/2018       Assessment & Plan:   Problem List Items Addressed This Visit      Other   Testicular swelling - Primary    Swelling has continued since October 2021. He has intermittent achy pain that comes and goes. Ultrasound from August 01, 2020 reviewed which showed no mass or torsion and left hydrocele with prominent left epididymis. Recommend continue with ice and ibuprofen 800mg  TID. Will refer to urology. Call if symptoms worsen or do not hear from urology to schedule appointment within next week.       Relevant Orders   Ambulatory referral to Urology       No orders of the defined types were placed in this encounter.    , NP

## 2020-11-13 NOTE — Assessment & Plan Note (Addendum)
Swelling has continued since October 2021. He has intermittent achy pain that comes and goes. Ultrasound from August 01, 2020 reviewed which showed no mass or torsion and left hydrocele with prominent left epididymis. Recommend continue with ice and ibuprofen 800mg  TID. Will refer to urology. Call if symptoms worsen or do not hear from urology to schedule appointment within next week.

## 2020-11-15 NOTE — Progress Notes (Signed)
I saw and evaluated the above patient on 11/13/20.  The case was discussed on rounds with Lauren McElwee, DNP. I personally reviewed the HPI, PH, FH, SH, ROS and medications. I repeated pertinent portions of the examination and reviewed the relevant imaging and laboratory data. I agree with the findings, assessment and plan as documented.   

## 2020-11-26 ENCOUNTER — Other Ambulatory Visit: Payer: Self-pay | Admitting: Family Medicine

## 2020-11-26 ENCOUNTER — Ambulatory Visit: Payer: BC Managed Care – PPO | Admitting: Urology

## 2020-11-30 ENCOUNTER — Ambulatory Visit: Payer: BC Managed Care – PPO | Admitting: Urology

## 2020-12-12 ENCOUNTER — Encounter: Payer: Self-pay | Admitting: Urology

## 2020-12-12 ENCOUNTER — Other Ambulatory Visit: Payer: Self-pay

## 2020-12-12 ENCOUNTER — Ambulatory Visit: Payer: BC Managed Care – PPO | Admitting: Urology

## 2020-12-12 VITALS — BP 146/81 | HR 82 | Ht 72.0 in | Wt 250.0 lb

## 2020-12-12 DIAGNOSIS — N433 Hydrocele, unspecified: Secondary | ICD-10-CM | POA: Diagnosis not present

## 2020-12-12 NOTE — Progress Notes (Signed)
° °  12/12/2020 10:36 AM   Daniel Martinez June 28, 1991 [id number]  Referring provider: Gerre Scull, NP [address] [place],  [place] [address]  Chief Complaint  Patient presents with   Testicle Pain    HPI: Daniel Martinez is a 30 y.o. male who presents for evaluation of a left hemiscrotal swelling.   Seen by PCP 08/01/2020 with a 1 day history of left hemiscrotal pain and swelling; noted to have left scrotal enlargement  Scrotal ultrasound remarkable for a prominent left epididymis and a moderate left hydrocele  Was treated with doxycycline with resolution of his pain but states the scrotal swelling has persisted  Urinalysis was unremarkable and DNA probe for GC/chlamydia was negative  Seen by PCP 11/14/2019 and follow-up complaining of persistent swelling and urology evaluation was recommended  Scrotal swelling described as slightly uncomfortable but not really painful  No bothersome LUTS   PMH: Past Medical History:  Diagnosis Date   Hypertension     Surgical History: Past Surgical History:  Procedure Laterality Date   WISDOM TOOTH EXTRACTION Bilateral 2021    Home Medications:  Allergies as of 12/12/2020   No Known Allergies     Medication List       Accurate as of December 12, 2020 10:36 AM. If you have any questions, ask your nurse or doctor.        lisinopril 20 MG tablet Commonly known as: ZESTRIL TAKE 1 TABLET(20 MG) BY MOUTH DAILY       Allergies: No Known Allergies  Family History: Family History  Problem Relation Age of Onset   Diabetes Mother     Social History:  reports that he has never smoked. He has never used smokeless tobacco. He reports that he does not drink alcohol and does not use drugs.   Physical Exam: BP (!) 146/81    Pulse 82    Ht 6' (1.829 m)    Wt 250 lb (113.4 kg)    BMI 33.91 kg/m   Constitutional:  Alert and oriented, No acute distress. HEENT: [name], moist mucus membranes.  Trachea midline, no  masses. Cardiovascular: No clubbing, cyanosis, or edema. Respiratory: Normal respiratory effort, no increased work of breathing. GU: Phallus without lesions, right testis palpably normal without masses or tenderness.  Moderate left hydrocele; left testis not palpable secondary to hydrocele Skin: No rashes, bruises or suspicious lesions. Neurologic: Grossly intact, no focal deficits, moving all 4 extremities. Psychiatric: Normal mood and affect.   Pertinent Imaging: Scrotal ultrasound images personally reviewed and interpreted  Assessment & Plan:    1.  Left scrotal swelling  Scrotal swelling secondary to reactive hydrocele  Management options were discussed including surveillance, aspiration and hydrocelectomy  The high recurrence rate of aspiration without sclerotherapy was discussed however may not recur since this appears to have been a reactive hydrocele secondary to epididymitis  He has opted for aspiration since he would not have to take any time off work and will schedule   Riki Altes, MD  [facility] [address] [place], [place] [address] [phone]

## 2020-12-14 LAB — URINALYSIS, COMPLETE
Bilirubin, UA: NEGATIVE
Glucose, UA: NEGATIVE
Ketones, UA: NEGATIVE
Leukocytes,UA: NEGATIVE
Nitrite, UA: NEGATIVE
Protein,UA: NEGATIVE
RBC, UA: NEGATIVE
Specific Gravity, UA: 1.025 (ref 1.005–1.030)
Urobilinogen, Ur: 0.2 mg/dL (ref 0.2–1.0)
pH, UA: 6.5 (ref 5.0–7.5)

## 2020-12-14 LAB — MICROSCOPIC EXAMINATION
Bacteria, UA: NONE SEEN
Epithelial Cells (non renal): NONE SEEN /hpf (ref 0–10)
RBC, Urine: NONE SEEN /hpf (ref 0–2)

## 2020-12-21 NOTE — Progress Notes (Signed)
12/24/2020  Chief Complaint  Patient presents with  . Hydrocele     HPI: Daniel Martinez is a 30 y.o. male who returns today for hydrocele aspiration    Please see previous notes for details.      Blood pressure (!) 149/79, pulse 89, height 6' (1.829 m), weight 250 lb (113.4 kg). NED. A&Ox3.   No respiratory distress   Abd soft, NT, ND Normal phallus with bilateral descended testicles    Hydrocele aspiration procedure Note  Patient identification was confirmed, informed consent was obtained, and patient was prepped using Betadine solution.    Pre-Procedure: -Scrotal sonogram was performed and on the anterior aspect of the left hemiscrotum the testis was well posterior to the hydrocele fluid  Procedure: -The scrotal skin was anesthetized with 1% plain Xylocaine anteriorly -A 20 gauge Angiocath was inserted into the anterior aspect of the scrotum; the needle was removed and 50 cc of straw-colored fluid was aspirated -No significant bleeding was noted  Post-Procedure: - Patient tolerated the procedure well   Assessment/ Plan:  1.  Left hydrocele  Successful aspiration.  We discussed the possibility of fluid recurrence and he will return as needed   I, Theador Hawthorne, am acting as a scribe for Dr. Lorin Picket C. Stoioff,  I have reviewed the above documentation for accuracy and completeness, and I agree with the above.   Riki Altes, MD

## 2020-12-24 ENCOUNTER — Other Ambulatory Visit: Payer: Self-pay

## 2020-12-24 ENCOUNTER — Ambulatory Visit (INDEPENDENT_AMBULATORY_CARE_PROVIDER_SITE_OTHER): Payer: BC Managed Care – PPO | Admitting: Urology

## 2020-12-24 ENCOUNTER — Encounter: Payer: Self-pay | Admitting: Urology

## 2020-12-24 VITALS — BP 149/79 | HR 89 | Ht 72.0 in | Wt 250.0 lb

## 2020-12-24 DIAGNOSIS — N433 Hydrocele, unspecified: Secondary | ICD-10-CM

## 2020-12-26 ENCOUNTER — Encounter: Payer: Self-pay | Admitting: Urology

## 2020-12-26 IMAGING — US US SCROTUM W/ DOPPLER COMPLETE
1 series · 14 of 25 positions shown · non-contrast
Comparison: None.

CLINICAL DATA: Left testicular swelling, no trauma

EXAM:
SCROTAL ULTRASOUND
DOPPLER ULTRASOUND OF THE TESTICLES
TECHNIQUE: Complete ultrasound examination of the testicles, epididymis, and
other scrotal structures was performed. Color and spectral Doppler
ultrasound were also utilized to evaluate blood flow to the
testicles.

[Series 1: us scrotum w/ doppler complete · 0.07mm/px · 68 acquisitions, 14 frames shown]
[im 1/68]
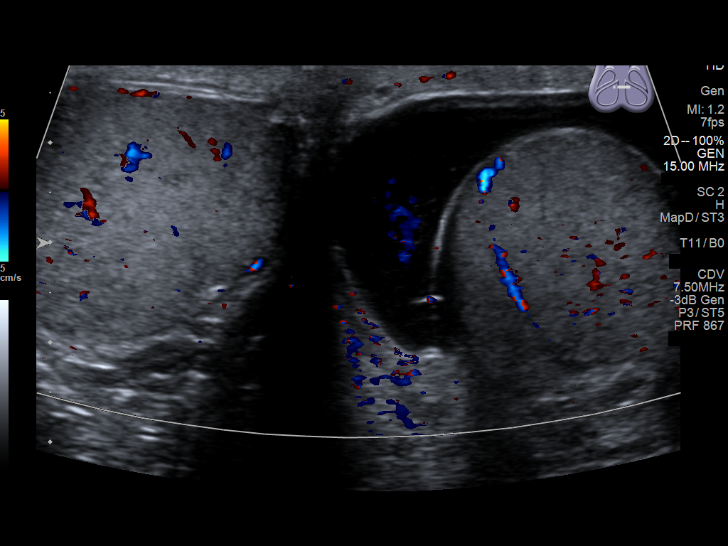
[im 6/68]
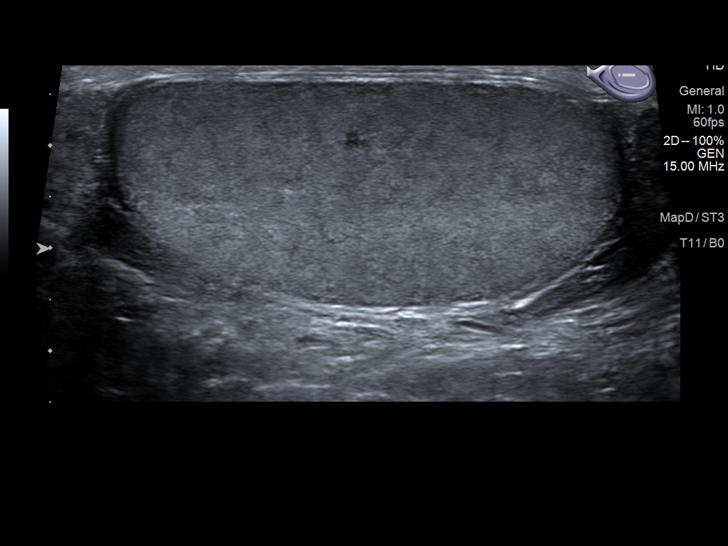
[im 12/68]
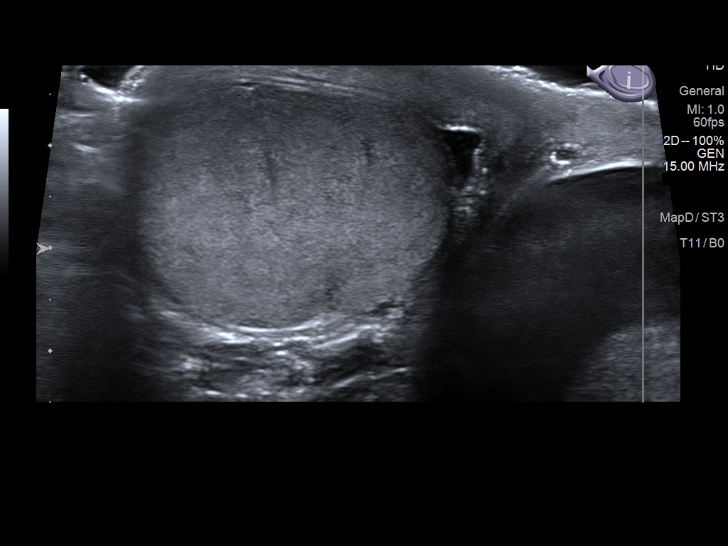
[im 17/68]
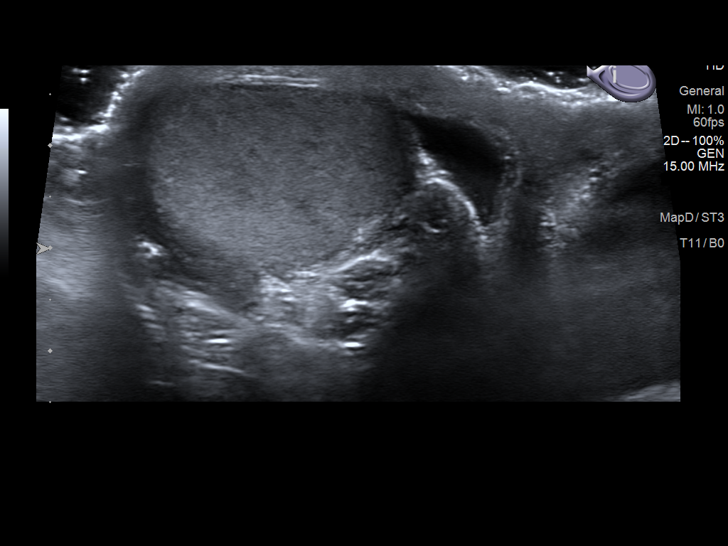
[im 23/68]
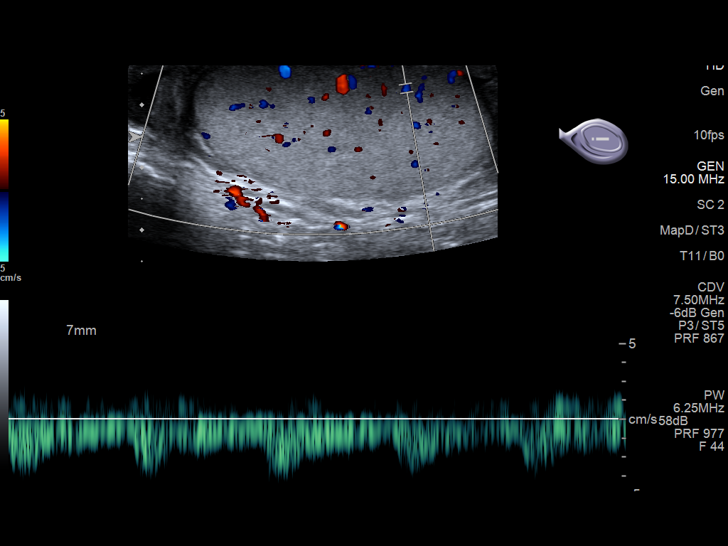
[im 26/68]
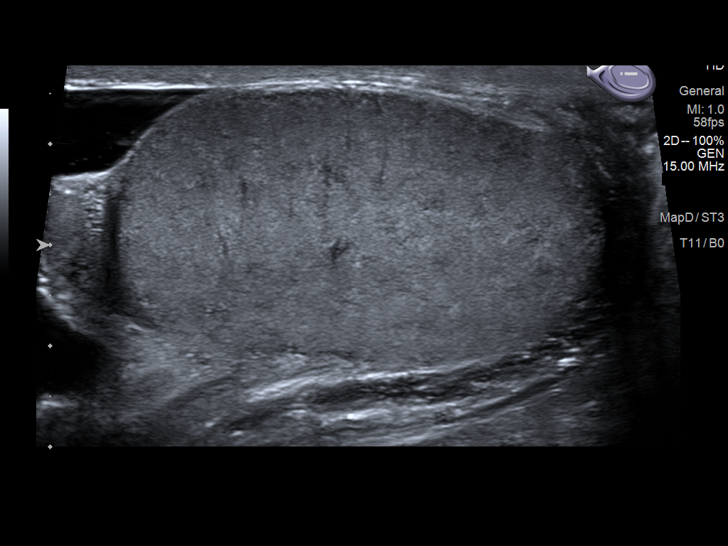
[im 31/68]
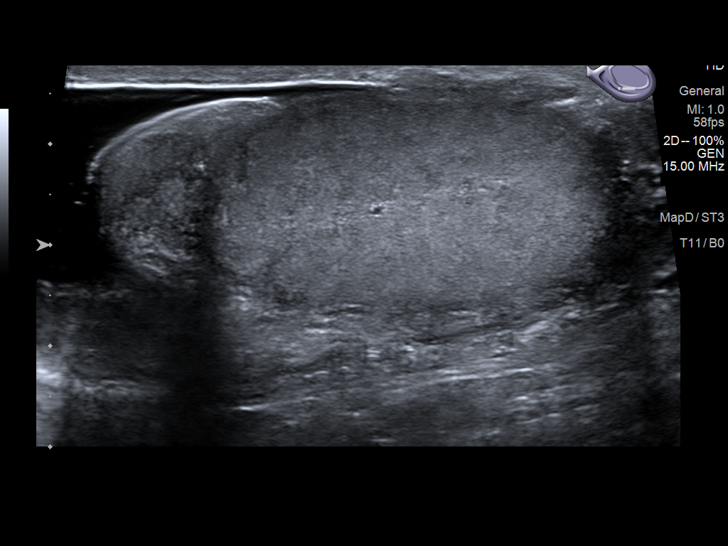
[im 37/68]
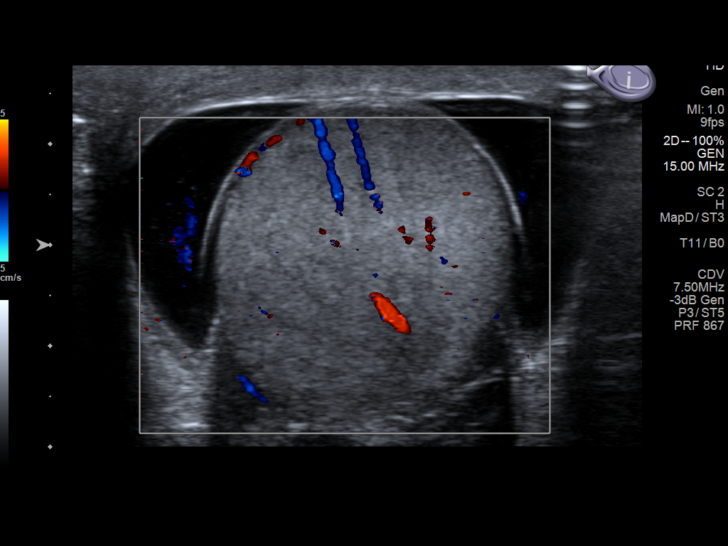
[im 42/68]
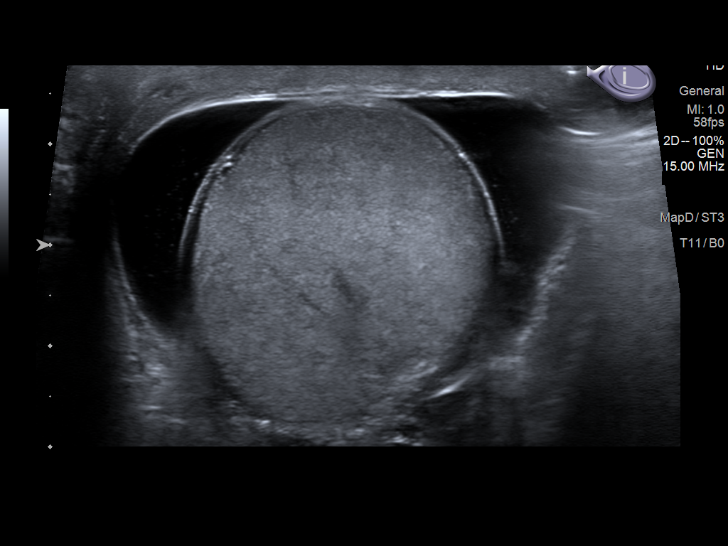
[im 45/68]
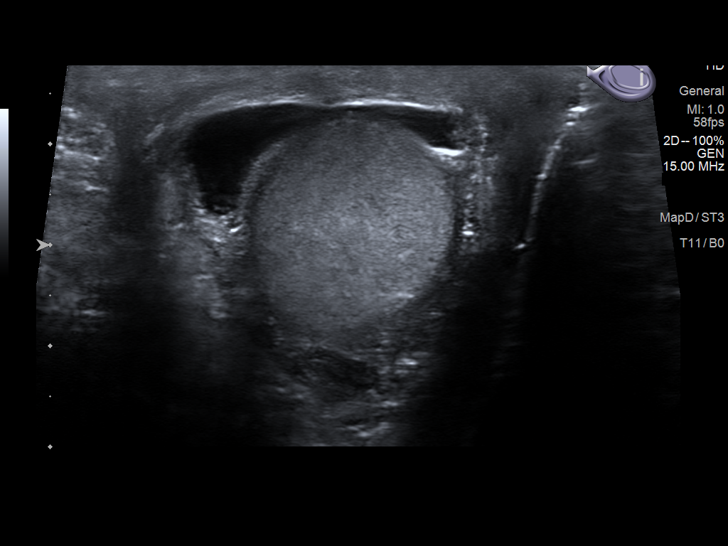
[im 51/68]
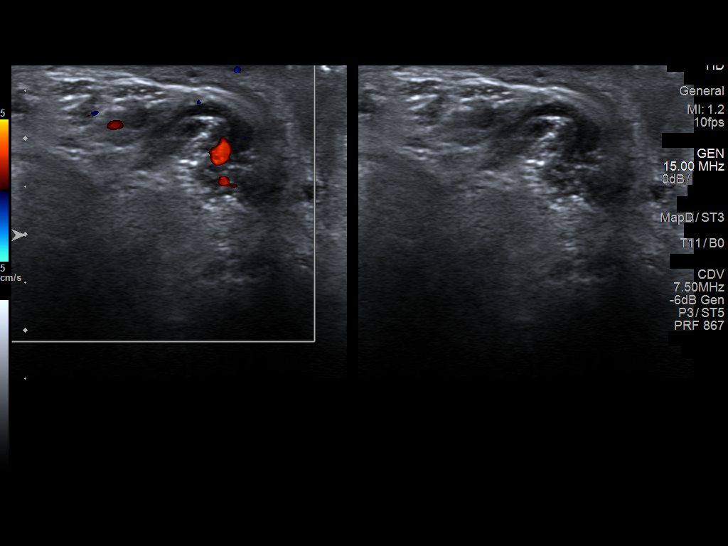
[im 56/68]
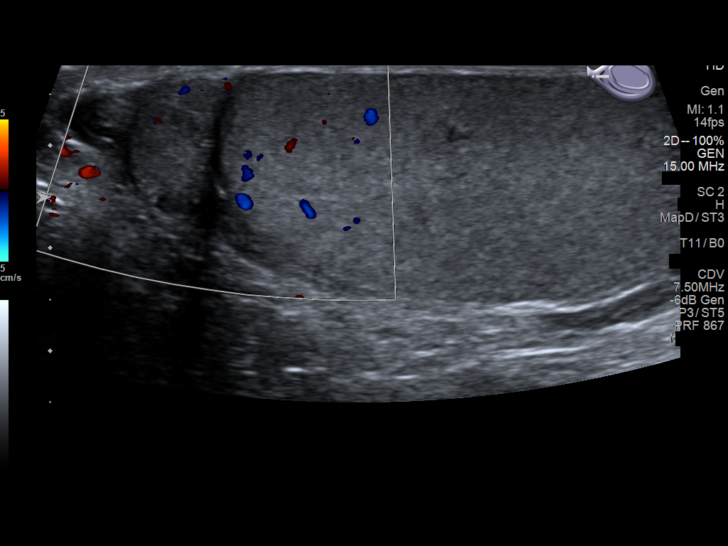
[im 62/68]
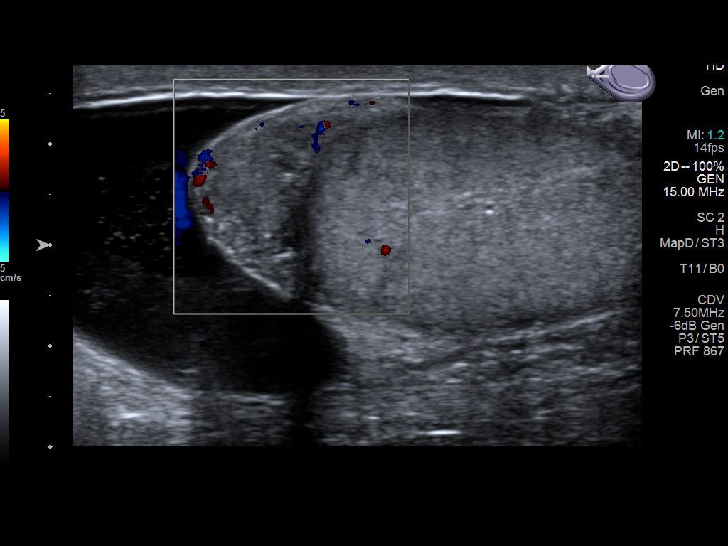
[im 68/68]
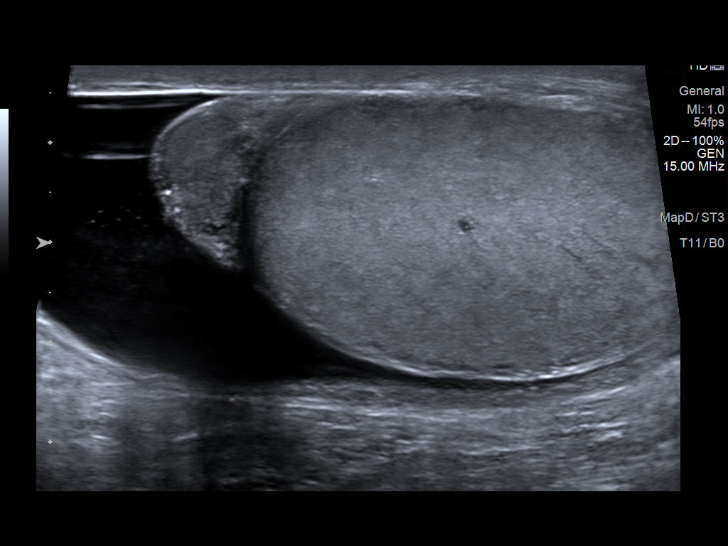

[14 of 25 positions shown; findings below may reference images not displayed]

FINDINGS: Right testicle

Measurements: 5 x 2.2 x 3 cm.  No mass or microlithiasis visualized.

Left testicle

Measurements: 4.8 x 2.7 x 2.9 cm. No mass or microlithiasis
visualized.

Right epididymis:  Normal in size and appearance.

Left epididymis:  Prominent, without hyperemia or focal lesion.

Hydrocele:  Moderate left.

Varicocele:  None visualized.

Pulsed Doppler interrogation of both testes demonstrates normal low
resistance arterial and venous waveforms bilaterally.
IMPRESSION: 1. No testicular mass or torsion.
2. Left hydrocele with prominent left epididymis, nonspecific.

## 2021-01-16 ENCOUNTER — Ambulatory Visit (INDEPENDENT_AMBULATORY_CARE_PROVIDER_SITE_OTHER): Payer: BC Managed Care – PPO | Admitting: Family Medicine

## 2021-01-16 ENCOUNTER — Other Ambulatory Visit: Payer: Self-pay

## 2021-01-16 ENCOUNTER — Encounter: Payer: Self-pay | Admitting: Family Medicine

## 2021-01-16 VITALS — BP 120/75 | HR 76 | Temp 98.9°F | Ht 71.7 in | Wt 253.2 lb

## 2021-01-16 DIAGNOSIS — Z0000 Encounter for general adult medical examination without abnormal findings: Secondary | ICD-10-CM | POA: Diagnosis not present

## 2021-01-16 DIAGNOSIS — I10 Essential (primary) hypertension: Secondary | ICD-10-CM

## 2021-01-16 MED ORDER — LISINOPRIL 20 MG PO TABS: 20.0000 mg | ORAL_TABLET | Freq: Every day | ORAL | 1 refills | Status: DC

## 2021-01-16 NOTE — Progress Notes (Signed)
BP 120/75 (BP Location: Left Arm, Cuff Size: Normal)   Pulse 76   Temp 98.9 F (37.2 C) (Oral)   Ht 5' 11.7" (1.821 m)   Wt 253 lb 3.2 oz (114.9 kg)   SpO2 98%   BMI 34.63 kg/m    Subjective:    Patient ID: Daniel Martinez, male    DOB: 1991/08/16, 30 y.o.   MRN: [id number]  HPI: Daniel Martinez is a 30 y.o. male presenting on 01/16/2021 for comprehensive medical examination. Current medical complaints include:  HYPERTENSION Hypertension status: controlled  Satisfied with current treatment? yes Duration of hypertension: chronic BP monitoring frequency:  not checking BP medication side effects:  no Medication compliance: excellent compliance Previous BP meds: lisinopril Aspirin: no Recurrent headaches: no Visual changes: no Palpitations: no Dyspnea: no Chest pain: no Lower extremity edema: no Dizzy/lightheaded: no  He currently lives with: parents  Interim Problems from his last visit: no  Depression Screen done today and results listed below:  [facility] 01/16/2021 07/17/2020 12/27/2019 12/09/2017 06/13/2016  Decreased Interest 0 0 1 3 0  Down, Depressed, Hopeless 1 0 1 3 0  PHQ - 2 Score 1 0 2 6 0  Altered sleeping - 0 1 2 1   Tired, decreased energy - 0 1 1 1   Change in appetite - 0 0 0 0  Feeling bad or failure about yourself  - 0 0 0 0  Trouble concentrating - 0 0 0 0  Moving slowly or fidgety/restless - 0 0 0 0  Suicidal thoughts - 0 0 0 0  PHQ-9 Score - 0 4 9 2   Difficult doing work/chores - Not difficult at all Not difficult at all Somewhat difficult -     Past Medical History:  Past Medical History:  Diagnosis Date  . Hypertension     Surgical History:  Past Surgical History:  Procedure Laterality Date  . WISDOM TOOTH EXTRACTION Bilateral 2021    Medications:  No current outpatient medications on file prior to visit.   No current facility-administered medications on file prior to visit.    Allergies:  No Known  Allergies  Social History:  Social History   Socioeconomic History  . Marital status: Single    Spouse name: Not on file  . Number of children: Not on file  . Years of education: Not on file  . Highest education level: Not on file  Occupational History  . Not on file  Tobacco Use  . Smoking status: Never Smoker  . Smokeless tobacco: Never Used  Vaping Use  . Vaping Use: Never used  Substance and Sexual Activity  . Alcohol use: No  . Drug use: No  . Sexual activity: Not Currently  Other Topics Concern  . Not on file  Social History Narrative  . Not on file   Social Determinants of Health   Financial Resource Strain: Not on file  Food Insecurity: Not on file  Transportation Needs: Not on file  Physical Activity: Not on file  Stress: Not on file  Social Connections: Not on file  Intimate Partner Violence: Not on file   Social History   Tobacco Use  Smoking Status Never Smoker  Smokeless Tobacco Never Used   Social History   Substance and Sexual Activity  Alcohol Use No    Family History:  Family History  Problem Relation Age of Onset  . Diabetes Mother     Past medical history, surgical history, medications, allergies, family history  and social history reviewed with patient today and changes made to appropriate areas of the chart.   Review of Systems  Constitutional: Negative.   HENT: Negative.   Eyes: Negative.   Respiratory: Negative.   Cardiovascular: Negative.   Gastrointestinal: Positive for constipation and heartburn. Negative for abdominal pain, blood in stool, diarrhea, melena, nausea and vomiting.  Genitourinary: Negative.   Musculoskeletal: Negative.   Skin: Negative.   Neurological: Negative.   Endo/Heme/Allergies: Positive for environmental allergies and polydipsia. Does not bruise/bleed easily.  Psychiatric/Behavioral: Negative.    All other ROS negative except what is listed above and in the HPI.      Objective:    BP 120/75 (BP  Location: Left Arm, Cuff Size: Normal)   Pulse 76   Temp 98.9 F (37.2 C) (Oral)   Ht 5' 11.7" (1.821 m)   Wt 253 lb 3.2 oz (114.9 kg)   SpO2 98%   BMI 34.63 kg/m   Wt Readings from Last 3 Encounters:  01/16/21 253 lb 3.2 oz (114.9 kg)  12/24/20 250 lb (113.4 kg)  12/12/20 250 lb (113.4 kg)    Physical Exam Vitals and nursing note reviewed.  Constitutional:      General: He is not in acute distress.    Appearance: Normal appearance. He is obese. He is not ill-appearing, toxic-appearing or diaphoretic.  HENT:     Head: Normocephalic and atraumatic.     Right Ear: Tympanic membrane, ear canal and external ear normal. There is no impacted cerumen.     Left Ear: Tympanic membrane, ear canal and external ear normal. There is no impacted cerumen.     Nose: Nose normal. No congestion or rhinorrhea.     Mouth/Throat:     Mouth: Mucous membranes are moist.     Pharynx: Oropharynx is clear. No oropharyngeal exudate or posterior oropharyngeal erythema.  Eyes:     General: No scleral icterus.       Right eye: No discharge.        Left eye: No discharge.     Extraocular Movements: Extraocular movements intact.     Conjunctiva/sclera: Conjunctivae normal.     Pupils: Pupils are equal, round, and reactive to light.  Neck:     Vascular: No carotid bruit.  Cardiovascular:     Rate and Rhythm: Normal rate and regular rhythm.     Pulses: Normal pulses.     Heart sounds: No murmur heard. No friction rub. No gallop.   Pulmonary:     Effort: Pulmonary effort is normal. No respiratory distress.     Breath sounds: Normal breath sounds. No stridor. No wheezing, rhonchi or rales.  Chest:     Chest wall: No tenderness.  Abdominal:     General: Abdomen is flat. Bowel sounds are normal. There is no distension.     Palpations: Abdomen is soft. There is no mass.     Tenderness: There is no abdominal tenderness. There is no right CVA tenderness, left CVA tenderness, guarding or rebound.     Hernia:  No hernia is present.  Genitourinary:    Comments: Genital exam deferred with shared decision making Musculoskeletal:        General: No swelling, tenderness, deformity or signs of injury.     Cervical back: Normal range of motion and neck supple. No rigidity. No muscular tenderness.     Right lower leg: No edema.     Left lower leg: No edema.  Lymphadenopathy:     Cervical:  No cervical adenopathy.  Skin:    General: Skin is warm and dry.     Capillary Refill: Capillary refill takes less than 2 seconds.     Coloration: Skin is not jaundiced or pale.     Findings: No bruising, erythema, lesion or rash.  Neurological:     General: No focal deficit present.     Mental Status: He is alert and oriented to person, place, and time.     Cranial Nerves: No cranial nerve deficit.     Sensory: No sensory deficit.     Motor: No weakness.     Coordination: Coordination normal.     Gait: Gait normal.     Deep Tendon Reflexes: Reflexes normal.  Psychiatric:        Mood and Affect: Mood normal.        Behavior: Behavior normal.        Thought Content: Thought content normal.        Judgment: Judgment normal.     Results for orders placed or performed in visit on 12/12/20  Microscopic Examination   Urine  Result Value Ref Range   WBC, UA 0-5 0 - 5 /hpf   RBC None seen 0 - 2 /hpf   Epithelial Cells (non renal) None seen 0 - 10 /hpf   Casts Present (A) None seen /lpf   Cast Type Hyaline casts N/A   Bacteria, UA None seen None seen/Few  Urinalysis, Complete  Result Value Ref Range   Specific Gravity, UA 1.025 1.005 - 1.030   pH, UA 6.5 5.0 - 7.5   Color, UA Yellow Yellow   Appearance Ur Clear Clear   Leukocytes,UA Negative Negative   Protein,UA Negative Negative/Trace   Glucose, UA Negative Negative   Ketones, UA Negative Negative   RBC, UA Negative Negative   Bilirubin, UA Negative Negative   Urobilinogen, Ur 0.2 0.2 - 1.0 mg/dL   Nitrite, UA Negative Negative   Microscopic  Examination See below:       Assessment & Plan:   Problem List Items Addressed This Visit      Cardiovascular and Mediastinum   Essential hypertension    Under good control on current regimen. Continue current regimen. Continue to monitor. Call with any concerns. Refills given. Labs to be drawn shortly.       Relevant Medications   lisinopril (ZESTRIL) 20 MG tablet   Other Relevant Orders   Microalbumin, Urine Waived    Other Visit Diagnoses    Routine general medical examination at a health care facility    -  Primary   Vaccines up to date. Screening labs checked today. Continue diet and exercise. Call with any concerns. Continue to monitor.    Relevant Orders   Comprehensive metabolic panel   CBC with Differential/Platelet   Lipid Panel w/o Chol/HDL Ratio   TSH   Urinalysis, Routine w reflex microscopic       LABORATORY TESTING:  Health maintenance labs ordered today as discussed above.   IMMUNIZATIONS:   - Tdap: Tetanus vaccination status reviewed: last tetanus booster within 10 years. - Influenza: Up to date - Pneumovax: Not applicable - COVID: Up to date  PATIENT COUNSELING:    Sexuality: Discussed sexually transmitted diseases, partner selection, use of condoms, avoidance of unintended pregnancy  and contraceptive alternatives.   Advised to avoid cigarette smoking.  I discussed with the patient that most people either abstain from alcohol or drink within safe limits (<=14/week and <=4 drinks/occasion for  males, <=7/weeks and <= 3 drinks/occasion for females) and that the risk for alcohol disorders and other health effects rises proportionally with the number of drinks per week and how often a drinker exceeds daily limits.  Discussed cessation/primary prevention of drug use and availability of treatment for abuse.   Diet: Encouraged to adjust caloric intake to maintain  or achieve ideal body weight, to reduce intake of dietary saturated fat and total fat, to  limit sodium intake by avoiding high sodium foods and not adding table salt, and to maintain adequate dietary potassium and calcium preferably from fresh fruits, vegetables, and low-fat dairy products.    stressed the importance of regular exercise  Injury prevention: Discussed safety belts, safety helmets, smoke detector, smoking near bedding or upholstery.   Dental health: Discussed importance of regular tooth brushing, flossing, and dental visits.   Follow up plan: NEXT PREVENTATIVE PHYSICAL DUE IN 1 YEAR. Return in about 6 months (around 07/18/2021).

## 2021-01-16 NOTE — Patient Instructions (Signed)

## 2021-01-16 NOTE — Assessment & Plan Note (Signed)
Under good control on current regimen. Continue current regimen. Continue to monitor. Call with any concerns. Refills given. Labs to be drawn shortly.  

## 2021-01-22 ENCOUNTER — Other Ambulatory Visit: Payer: Self-pay

## 2021-01-22 MED ORDER — LISINOPRIL 20 MG PO TABS: 20.0000 mg | ORAL_TABLET | Freq: Every day | ORAL | 1 refills | Status: DC

## 2021-01-22 NOTE — Telephone Encounter (Signed)
Patient seen last week. Needs RX sent to mail order please.

## 2021-01-23 ENCOUNTER — Encounter: Payer: Self-pay | Admitting: Family Medicine

## 2021-02-01 ENCOUNTER — Other Ambulatory Visit: Payer: Self-pay

## 2021-02-01 ENCOUNTER — Other Ambulatory Visit: Payer: BC Managed Care – PPO

## 2021-02-01 DIAGNOSIS — Z136 Encounter for screening for cardiovascular disorders: Secondary | ICD-10-CM | POA: Diagnosis not present

## 2021-02-01 DIAGNOSIS — Z0000 Encounter for general adult medical examination without abnormal findings: Secondary | ICD-10-CM

## 2021-02-01 DIAGNOSIS — I10 Essential (primary) hypertension: Secondary | ICD-10-CM | POA: Diagnosis not present

## 2021-02-01 LAB — MICROSCOPIC EXAMINATION

## 2021-02-01 LAB — URINALYSIS, ROUTINE W REFLEX MICROSCOPIC
Bilirubin, UA: NEGATIVE
Glucose, UA: NEGATIVE
Ketones, UA: NEGATIVE
Leukocytes,UA: NEGATIVE
Nitrite, UA: NEGATIVE
Protein,UA: NEGATIVE
RBC, UA: NEGATIVE
Specific Gravity, UA: 1.02 (ref 1.005–1.030)
Urobilinogen, Ur: 0.2 mg/dL (ref 0.2–1.0)
pH, UA: 6 (ref 5.0–7.5)

## 2021-02-01 LAB — MICROALBUMIN, URINE WAIVED
Creatinine, Urine Waived: 300 mg/dL (ref 10–300)
Microalb, Ur Waived: 10 mg/L (ref 0–19)
Microalb/Creat Ratio: <30 mg/g (ref <30)

## 2021-02-02 LAB — CBC WITH DIFFERENTIAL/PLATELET
Basophils Absolute: 0.1 10*3/uL (ref 0.0–0.2)
Basos: 1 %
EOS (ABSOLUTE): 0.5 10*3/uL — ABNORMAL HIGH (ref 0.0–0.4)
Eos: 6 %
Hematocrit: 45 % (ref 37.5–51.0)
Hemoglobin: 15 g/dL (ref 13.0–17.7)
Immature Grans (Abs): 0.1 10*3/uL (ref 0.0–0.1)
Immature Granulocytes: 1 %
Lymphocytes Absolute: 2.7 10*3/uL (ref 0.7–3.1)
Lymphs: 32 %
MCH: 28.2 pg (ref 26.6–33.0)
MCHC: 33.3 g/dL (ref 31.5–35.7)
MCV: 85 fL (ref 79–97)
Monocytes Absolute: 0.6 10*3/uL (ref 0.1–0.9)
Monocytes: 7 %
Neutrophils Absolute: 4.4 10*3/uL (ref 1.4–7.0)
Neutrophils: 53 %
Platelets: 280 10*3/uL (ref 150–450)
RBC: 5.32 x10E6/uL (ref 4.14–5.80)
RDW: 13.5 % (ref 11.6–15.4)
WBC: 8.3 10*3/uL (ref 3.4–10.8)

## 2021-02-02 LAB — COMPREHENSIVE METABOLIC PANEL
ALT: 39 IU/L (ref 0–44)
AST: 20 IU/L (ref 0–40)
Albumin/Globulin Ratio: 2.1 (ref 1.2–2.2)
Albumin: 4.8 g/dL (ref 4.1–5.2)
Alkaline Phosphatase: 72 IU/L (ref 44–121)
BUN/Creatinine Ratio: 12 (ref 9–20)
BUN: 11 mg/dL (ref 6–20)
Bilirubin Total: 0.5 mg/dL (ref 0.0–1.2)
CO2: 23 mmol/L (ref 20–29)
Calcium: 9.1 mg/dL (ref 8.7–10.2)
Chloride: 97 mmol/L (ref 96–106)
Creatinine, Ser: 0.95 mg/dL (ref 0.76–1.27)
Globulin, Total: 2.3 g/dL (ref 1.5–4.5)
Glucose: 75 mg/dL (ref 65–99)
Potassium: 4 mmol/L (ref 3.5–5.2)
Sodium: 136 mmol/L (ref 134–144)
Total Protein: 7.1 g/dL (ref 6.0–8.5)
eGFR: 111 mL/min/{1.73_m2} (ref >59)

## 2021-02-02 LAB — LIPID PANEL W/O CHOL/HDL RATIO
Cholesterol, Total: 153 mg/dL (ref 100–199)
HDL: 49 mg/dL (ref >39)
LDL Chol Calc (NIH): 88 mg/dL (ref 0–99)
Triglycerides: 87 mg/dL (ref 0–149)
VLDL Cholesterol Cal: 16 mg/dL (ref 5–40)

## 2021-02-02 LAB — TSH: TSH: 2.17 u[IU]/mL (ref 0.450–4.500)

## 2021-02-04 ENCOUNTER — Encounter: Payer: Self-pay | Admitting: Family Medicine

## 2021-07-17 ENCOUNTER — Ambulatory Visit (INDEPENDENT_AMBULATORY_CARE_PROVIDER_SITE_OTHER): Payer: BC Managed Care – PPO | Admitting: Family Medicine

## 2021-07-17 ENCOUNTER — Other Ambulatory Visit: Payer: Self-pay

## 2021-07-17 ENCOUNTER — Encounter: Payer: Self-pay | Admitting: Family Medicine

## 2021-07-17 VITALS — BP 126/80 | HR 65 | Ht 71.0 in | Wt 253.0 lb

## 2021-07-17 DIAGNOSIS — Z23 Encounter for immunization: Secondary | ICD-10-CM

## 2021-07-17 DIAGNOSIS — M5432 Sciatica, left side: Secondary | ICD-10-CM

## 2021-07-17 DIAGNOSIS — I10 Essential (primary) hypertension: Secondary | ICD-10-CM | POA: Diagnosis not present

## 2021-07-17 MED ORDER — LISINOPRIL 20 MG PO TABS: 20.0000 mg | ORAL_TABLET | Freq: Every day | ORAL | 1 refills | Status: DC

## 2021-07-17 NOTE — Progress Notes (Signed)
BP 126/80   Pulse 65   Ht '5\' 11"'  (1.803 m)   Wt 253 lb (114.8 kg)   BMI 35.29 kg/m    Subjective:    Patient ID: Daniel Martinez, male    DOB: March 30, 1991, 30 y.o.   MRN: [id number]  HPI: Daniel Martinez is a 30 y.o. male  Chief Complaint  Patient presents with   Hypertension   HYPERTENSION Hypertension status: controlled  Satisfied with current treatment? yes Duration of hypertension: chronic BP monitoring frequency:  not checking BP medication side effects:  no Medication compliance: excellent compliance Previous BP meds: lisinopril Aspirin: no Recurrent headaches: no Visual changes: no Palpitations: no Dyspnea: no Chest pain: no Lower extremity edema: no Dizzy/lightheaded: no  BACK PAIN Duration: about a week Mechanism of injury: no trauma Location: L>R and low back Onset: gradual Severity: mild Quality: shooting and numb Frequency: with standing Radiation: L leg below the knee Aggravating factors: movement and prolonged sitting Alleviating factors: rest, ice, heat, laying, NSAIDs, and APAP Status: fluctuating Treatments attempted: rest, ice, heat, APAP, ibuprofen, and aleve  Relief with NSAIDs?: No NSAIDs Taken Nighttime pain:  no Paresthesias / decreased sensation:  no Bowel / bladder incontinence:  no Fevers:  no Dysuria / urinary frequency:  no   Relevant past medical, surgical, family and social history reviewed and updated as indicated. Interim medical history since our last visit reviewed. Allergies and medications reviewed and updated.  Review of Systems  Constitutional: Negative.   Respiratory: Negative.    Cardiovascular: Negative.   Gastrointestinal: Negative.   Musculoskeletal:  Positive for myalgias. Negative for arthralgias, back pain, gait problem, joint swelling, neck pain and neck stiffness.  Psychiatric/Behavioral: Negative.     Per HPI unless specifically indicated above     Objective:    BP 126/80   Pulse 65   Ht 5'  11" (1.803 m)   Wt 253 lb (114.8 kg)   BMI 35.29 kg/m   Wt Readings from Last 3 Encounters:  07/17/21 253 lb (114.8 kg)  01/16/21 253 lb 3.2 oz (114.9 kg)  12/24/20 250 lb (113.4 kg)    Physical Exam Vitals and nursing note reviewed.  Constitutional:      General: He is not in acute distress.    Appearance: Normal appearance. He is not ill-appearing, toxic-appearing or diaphoretic.  HENT:     Head: Normocephalic and atraumatic.     Right Ear: External ear normal.     Left Ear: External ear normal.     Nose: Nose normal.     Mouth/Throat:     Mouth: Mucous membranes are moist.     Pharynx: Oropharynx is clear.  Eyes:     General: No scleral icterus.       Right eye: No discharge.        Left eye: No discharge.     Extraocular Movements: Extraocular movements intact.     Conjunctiva/sclera: Conjunctivae normal.     Pupils: Pupils are equal, round, and reactive to light.  Cardiovascular:     Rate and Rhythm: Normal rate and regular rhythm.     Pulses: Normal pulses.     Heart sounds: Normal heart sounds. No murmur heard.   No friction rub. No gallop.  Pulmonary:     Effort: Pulmonary effort is normal. No respiratory distress.     Breath sounds: Normal breath sounds. No stridor. No wheezing, rhonchi or rales.  Chest:     Chest wall: No tenderness.  Musculoskeletal:  General: Normal range of motion.     Cervical back: Normal range of motion and neck supple.  Skin:    General: Skin is warm and dry.     Capillary Refill: Capillary refill takes less than 2 seconds.     Coloration: Skin is not jaundiced or pale.     Findings: No bruising, erythema, lesion or rash.  Neurological:     General: No focal deficit present.     Mental Status: He is alert and oriented to person, place, and time. Mental status is at baseline.  Psychiatric:        Mood and Affect: Mood normal.        Behavior: Behavior normal.        Thought Content: Thought content normal.        Judgment:  Judgment normal.    Results for orders placed or performed in visit on 02/01/21  Microscopic Examination   Urine  Result Value Ref Range   WBC, UA WILL FOLLOW    RBC WILL FOLLOW    Epithelial Cells (non renal) WILL FOLLOW    Renal Epithel, UA WILL FOLLOW    Casts WILL FOLLOW    Cast Type WILL FOLLOW    Crystals WILL FOLLOW    Crystal Type WILL FOLLOW    Mucus, UA WILL FOLLOW    Bacteria, UA WILL FOLLOW    Yeast, UA WILL FOLLOW    Trichomonas, UA WILL FOLLOW    Urinalysis Comments WILL FOLLOW   Microalbumin, Urine Waived  Result Value Ref Range   Microalb, Ur Waived 10 0 - 19 mg/L   Creatinine, Urine Waived 300 10 - 300 mg/dL   Microalb/Creat Ratio <30 <30 mg/g  Urinalysis, Routine w reflex microscopic  Result Value Ref Range   Specific Gravity, UA 1.020 1.005 - 1.030   pH, UA 6.0 5.0 - 7.5   Color, UA Yellow Yellow   Appearance Ur Clear Clear   Leukocytes,UA Negative Negative   Protein,UA Negative Negative/Trace   Glucose, UA Negative Negative   Ketones, UA Negative Negative   RBC, UA Negative Negative   Bilirubin, UA Negative Negative   Urobilinogen, Ur 0.2 0.2 - 1.0 mg/dL   Nitrite, UA Negative Negative  TSH  Result Value Ref Range   TSH 2.170 0.450 - 4.500 uIU/mL  Lipid Panel w/o Chol/HDL Ratio  Result Value Ref Range   Cholesterol, Total 153 100 - 199 mg/dL   Triglycerides 87 0 - 149 mg/dL   HDL 49 >39 mg/dL   VLDL Cholesterol Cal 16 5 - 40 mg/dL   LDL Chol Calc (NIH) 88 0 - 99 mg/dL  CBC with Differential/Platelet  Result Value Ref Range   WBC 8.3 3.4 - 10.8 x10E3/uL   RBC 5.32 4.14 - 5.80 x10E6/uL   Hemoglobin 15.0 13.0 - 17.7 g/dL   Hematocrit 45.0 37.5 - 51.0 %   MCV 85 79 - 97 fL   MCH 28.2 26.6 - 33.0 pg   MCHC 33.3 31.5 - 35.7 g/dL   RDW 13.5 11.6 - 15.4 %   Platelets 280 150 - 450 x10E3/uL   Neutrophils 53 Not Estab. %   Lymphs 32 Not Estab. %   Monocytes 7 Not Estab. %   Eos 6 Not Estab. %   Basos 1 Not Estab. %   Neutrophils Absolute 4.4  1.4 - 7.0 x10E3/uL   Lymphocytes Absolute 2.7 0.7 - 3.1 x10E3/uL   Monocytes Absolute 0.6 0.1 - 0.9 x10E3/uL   EOS (  ABSOLUTE) 0.5 (H) 0.0 - 0.4 x10E3/uL   Basophils Absolute 0.1 0.0 - 0.2 x10E3/uL   Immature Granulocytes 1 Not Estab. %   Immature Grans (Abs) 0.1 0.0 - 0.1 x10E3/uL  Comprehensive metabolic panel  Result Value Ref Range   Glucose 75 65 - 99 mg/dL   BUN 11 6 - 20 mg/dL   Creatinine, Ser 0.95 0.76 - 1.27 mg/dL   eGFR 111 >59 mL/min/1.73   BUN/Creatinine Ratio 12 9 - 20   Sodium 136 134 - 144 mmol/L   Potassium 4.0 3.5 - 5.2 mmol/L   Chloride 97 96 - 106 mmol/L   CO2 23 20 - 29 mmol/L   Calcium 9.1 8.7 - 10.2 mg/dL   Total Protein 7.1 6.0 - 8.5 g/dL   Albumin 4.8 4.1 - 5.2 g/dL   Globulin, Total 2.3 1.5 - 4.5 g/dL   Albumin/Globulin Ratio 2.1 1.2 - 2.2   Bilirubin Total 0.5 0.0 - 1.2 mg/dL   Alkaline Phosphatase 72 44 - 121 IU/L   AST 20 0 - 40 IU/L   ALT 39 0 - 44 IU/L      Assessment & Plan:   Problem List Items Addressed This Visit       Cardiovascular and Mediastinum   Essential hypertension - Primary    Under good control on current regimen. Continue current regimen. Continue to monitor. Call with any concerns. Refills given. Labs drawn today.        Relevant Medications   lisinopril (ZESTRIL) 20 MG tablet   Other Relevant Orders   Basic metabolic panel   Other Visit Diagnoses     Left sided sciatica       Will start stretches. Call with any concerns or if not getting better. Continue to monitor.         Follow up plan: Return in about 6 months (around 01/15/2022), or physical.

## 2021-07-17 NOTE — Assessment & Plan Note (Signed)
Under good control on current regimen. Continue current regimen. Continue to monitor. Call with any concerns. Refills given. Labs drawn today.   

## 2021-07-18 ENCOUNTER — Ambulatory Visit: Payer: BC Managed Care – PPO | Admitting: Family Medicine

## 2021-07-18 LAB — BASIC METABOLIC PANEL
BUN/Creatinine Ratio: 8 — ABNORMAL LOW (ref 9–20)
BUN: 8 mg/dL (ref 6–20)
CO2: 25 mmol/L (ref 20–29)
Calcium: 10 mg/dL (ref 8.7–10.2)
Chloride: 96 mmol/L (ref 96–106)
Creatinine, Ser: 0.95 mg/dL (ref 0.76–1.27)
Glucose: 94 mg/dL (ref 70–99)
Potassium: 4.4 mmol/L (ref 3.5–5.2)
Sodium: 135 mmol/L (ref 134–144)
eGFR: 110 mL/min/{1.73_m2} (ref >59)

## 2021-11-15 ENCOUNTER — Ambulatory Visit: Payer: Self-pay | Admitting: *Deleted

## 2021-11-15 NOTE — Telephone Encounter (Signed)
°  Chief Complaint: positive for Covid.   Wanting to see if he is a candidate for Paxlovid Symptoms: Headache, congestion and sore throat Frequency: Started yesterday afternoon.   Tested positive this morning at home and at [facility] Pertinent Negatives: Patient denies fever Disposition: [] ED /[] Urgent Care (no appt availability in office) / [x] Appointment(In office/virtual)/ []  [facility]/ [] Home Care/ [] Refused Recommended Disposition /[] [facility]/ []  Follow-up with PCP Additional Notes: His call was warm transferred into the office to [facility] to be scheduled as there were no virtual visits available within the 5 day timeframe for paxlovid.

## 2021-11-15 NOTE — Telephone Encounter (Signed)
I returned pt's call.   He called in c/o headache, sore throat, congestion that started yesterday.  Tested positive for Covid.   Wanting to know if he is a candidate for Paxlovid.  If so send to [place] at [facility] in [place].    [phone].    Fax [phone].   Reason for Disposition  [1] COVID-19 diagnosed by positive lab test (e.g., PCR, rapid self-test kit) AND [2] NO symptoms (e.g., cough, fever, others)  Answer Assessment - Initial Assessment Questions 1. COVID-19 DIAGNOSIS: "Who made your COVID-19 diagnosis?" "Was it confirmed by a positive lab test or self-test?" If not diagnosed by a doctor (or NP/PA), ask "Are there lots of cases (community spread) where you live?" Note: See public health department website, if unsure.     Pt tested positive for Covid this morning.   Confirmed at [facility].   2. COVID-19 EXPOSURE: "Was there any known exposure to COVID before the symptoms began?" CDC Definition of close contact: within 6 feet (2 meters) for a total of 15 minutes or more over a 24-hour period.      *No Answer* 3. ONSET: "When did the COVID-19 symptoms start?"      Yesterday  4. WORST SYMPTOM: "What is your worst symptom?" (e.g., cough, fever, shortness of breath, muscle aches)     *No Answer* 5. COUGH: "Do you have a cough?" If Yes, ask: "How bad is the cough?"       Dry cough   clearing throat a lot 6. FEVER: "Do you have a fever?" If Yes, ask: "What is your temperature, how was it measured, and when did it start?"     No  Headache, sore throat and congestion.   7. RESPIRATORY STATUS: "Describe your breathing?" (e.g., shortness of breath, wheezing, unable to speak)      *No Answer* 8. BETTER-SAME-WORSE: "Are you getting better, staying the same or getting worse compared to yesterday?"  If getting worse, ask, "In what way?"     *No Answer* 9. HIGH RISK DISEASE: "Do you have any chronic medical problems?" (e.g., asthma, heart or lung disease,  weak immune system, obesity, etc.)     High BP    10. VACCINE: "Have you had the COVID-19 vaccine?" If Yes, ask: "Which one, how many shots, when did you get it?"       *No Answer* 11. BOOSTER: "Have you received your COVID-19 booster?" If Yes, ask: "Which one and when did you get it?"       *No Answer* 12. PREGNANCY: "Is there any chance you are pregnant?" "When was your last menstrual period?"       *No Answer* 13. OTHER SYMPTOMS: "Do you have any other symptoms?"  (e.g., chills, fatigue, headache, loss of smell or taste, muscle pain, sore throat)       *No Answer* 14. O2 SATURATION MONITOR:  "Do you use an oxygen saturation monitor (pulse oximeter) at home?" If Yes, ask "What is your reading (oxygen level) today?" "What is your usual oxygen saturation reading?" (e.g., 95%)       *No Answer*  Protocols used: Coronavirus (COVID-19) Diagnosed or Suspected-A-AH

## 2021-11-18 ENCOUNTER — Encounter: Payer: Self-pay | Admitting: Internal Medicine

## 2021-11-18 ENCOUNTER — Telehealth (INDEPENDENT_AMBULATORY_CARE_PROVIDER_SITE_OTHER): Payer: Self-pay | Admitting: Internal Medicine

## 2021-11-18 DIAGNOSIS — U071 COVID-19: Secondary | ICD-10-CM | POA: Insufficient documentation

## 2021-11-18 MED ORDER — FEXOFENADINE HCL 180 MG PO TABS: 180.0000 mg | ORAL_TABLET | Freq: Every day | ORAL | 1 refills | Status: DC

## 2021-11-18 NOTE — Progress Notes (Signed)
There were no vitals taken for this visit.   Subjective:    Patient ID: Daniel Martinez, male    DOB: 06/14/1991, 31 y.o.   MRN: [id number]  Chief Complaint  Patient presents with   Covid Positive    Tested positive on Friday. Stuffy nose, little tired and diarrhea. Is feeling better.     HPI: Daniel Martinez is a 31 y.o. male   This visit was completed via video visit through MyChart due to the restrictions of the COVID-19 pandemic. All issues as above were discussed and addressed. Physical exam was done as above through visual confirmation on video through MyChart. If it was felt that the patient should be evaluated in the office, they were directed there. The patient verbally consented to this visit. Location of the patient: home Location of the provider: work Those involved with this call:  Provider: Charlynne Cousins, MD CMA: Frazier Butt, [place] Desk/Registration: Myrlene Broker  Time spent on call: 15 minutes with patient face to face via video conference. More than 50% of this time was spent in counseling and coordination of care. 15 minutes total spent in review of patient's record and preparation of their chart.  Pt feels better since Friday was feeling bad on Friday   URI  This is a new problem. The current episode started in the past 7 days. There has been no fever. Associated symptoms include congestion, coughing and a sore throat. Pertinent negatives include no abdominal pain, chest pain, diarrhea, dysuria, ear pain, headaches, joint pain, joint swelling, nausea, neck pain, plugged ear sensation, rash, rhinorrhea, sinus pain, sneezing, swollen glands, vomiting or wheezing.   Chief Complaint  Patient presents with   Covid Positive    Tested positive on Friday. Stuffy nose, little tired and diarrhea. Is feeling better.     Relevant past medical, surgical, family and social history reviewed and updated as indicated. Interim medical history since our last visit  reviewed. Allergies and medications reviewed and updated.  Review of Systems  HENT:  Positive for congestion and sore throat. Negative for ear pain, rhinorrhea, sinus pain and sneezing.   Respiratory:  Positive for cough. Negative for wheezing.   Cardiovascular:  Negative for chest pain.  Gastrointestinal:  Negative for abdominal pain, diarrhea, nausea and vomiting.  Genitourinary:  Negative for difficulty urinating and dysuria.  Musculoskeletal:  Negative for joint pain and neck pain.  Skin:  Negative for rash.  Neurological:  Negative for headaches.   Per HPI unless specifically indicated above     Objective:    There were no vitals taken for this visit.  Wt Readings from Last 3 Encounters:  07/17/21 253 lb (114.8 kg)  01/16/21 253 lb 3.2 oz (114.9 kg)  12/24/20 250 lb (113.4 kg)    Physical Exam  Results for orders placed or performed in visit on 03/47/42  Basic metabolic panel  Result Value Ref Range   Glucose 94 70 - 99 mg/dL   BUN 8 6 - 20 mg/dL   Creatinine, Ser 0.95 0.76 - 1.27 mg/dL   eGFR 110 >59 mL/min/1.73   BUN/Creatinine Ratio 8 (L) 9 - 20   Sodium 135 134 - 144 mmol/L   Potassium 4.4 3.5 - 5.2 mmol/L   Chloride 96 96 - 106 mmol/L   CO2 25 20 - 29 mmol/L   Calcium 10.0 8.7 - 10.2 mg/dL        Current Outpatient Medications:    fexofenadine (ALLEGRA ALLERGY) 180 MG tablet,  Take 1 tablet (180 mg total) by mouth daily., Disp: 30 tablet, Rfl: 1   FIBER PO, Take by mouth., Disp: , Rfl:    lisinopril (ZESTRIL) 20 MG tablet, Take 1 tablet (20 mg total) by mouth daily., Disp: 90 tablet, Rfl: 1    Assessment & Plan:  COVID : positive :  Increase fluid intake. No indications for antivirals since pt is already feeling better. Headahce - tyelnol every 4-6 hrs prn and alternate this with ibubrufen 800 mg q 8 hrly. Sinus pressure: use steam inhalation.  OTC -  Allegra / claritin.  5 days quarantine.  Ok to rtw in 5 days if tests -ve follow    Problem List  Items Addressed This Visit       Other   COVID - Primary     No orders of the defined types were placed in this encounter.    Meds ordered this encounter  Medications   fexofenadine (ALLEGRA ALLERGY) 180 MG tablet    Sig: Take 1 tablet (180 mg total) by mouth daily.    Dispense:  30 tablet    Refill:  1     Follow up plan: No follow-ups on file.

## 2021-12-19 ENCOUNTER — Encounter: Payer: Self-pay | Admitting: Family Medicine

## 2021-12-21 ENCOUNTER — Other Ambulatory Visit: Payer: Self-pay | Admitting: Family Medicine

## 2021-12-23 NOTE — Telephone Encounter (Signed)
Requested Prescriptions  ?Pending Prescriptions Disp Refills  ?? lisinopril (ZESTRIL) 20 MG tablet [Pharmacy Med Name: LISINOPRIL 20MG  TABLETS] 90 tablet 1  ?  Sig: TAKE 1 TABLET(20 MG) BY MOUTH DAILY  ?  ? Cardiovascular:  ACE Inhibitors Passed - 12/21/2021  9:27 AM  ?  ?  Passed - Cr in normal range and within 180 days  ?  Creatinine, Ser  ?Date Value Ref Range Status  ?07/17/2021 0.95 0.76 - 1.27 mg/dL Final  ?   ?  ?  Passed - K in normal range and within 180 days  ?  Potassium  ?Date Value Ref Range Status  ?07/17/2021 4.4 3.5 - 5.2 mmol/L Final  ?   ?  ?  Passed - Patient is not pregnant  ?  ?  Passed - Last BP in normal range  ?  BP Readings from Last 1 Encounters:  ?07/17/21 126/80  ?   ?  ?  Passed - Valid encounter within last 6 months  ?  Recent Outpatient Visits   ?      ? 1 month ago COVID  ? [facility] Vigg, Avanti, MD  ? 5 months ago Essential hypertension  ? [place], DO  ? 11 months ago Routine general medical examination at a health care facility  ? [place], DO  ? 1 year ago Testicular swelling  ? [place], NP  ? 1 year ago Testicular swelling  ? [facility] [place], Henrine Screws T, NP  ?  ?  ?Future Appointments   ?        ? In 3 weeks Wynetta Emery, Barb Merino, DO [place]  ?  ? ?  ?  ?  ? ?

## 2022-01-09 ENCOUNTER — Ambulatory Visit: Payer: Self-pay

## 2022-01-09 DIAGNOSIS — H18831 Recurrent erosion of cornea, right eye: Secondary | ICD-10-CM | POA: Diagnosis not present

## 2022-01-09 NOTE — Telephone Encounter (Signed)
I can see him at 11: 20 if he wants ?

## 2022-01-09 NOTE — Telephone Encounter (Signed)
?  Chief Complaint: eye pain ?Symptoms: R eye pain and unable to keep eye open ?Frequency: today but same thing happened 3 months ago ?Pertinent Negatives: NA ?Disposition: [] ED /[x] Urgent Care (no appt availability in office) / [] Appointment(In office/virtual)/ []  [facility]/ [] Home Care/ [] Refused Recommended Disposition /[] [facility]/ []  Follow-up with PCP ?Additional Notes: Pt's dad had called in for pt. He was on the way to pick pt up. Had contacted eye dr but unable to get in touch with anyone. Attempted to schedule an appt but no appts available until 01/13/22. I advised dad to take pt to UC or Eye Dr d/t VV wouldn't be appropriate.  ? ? ?Summary: Trouble keeping eye open not sure why  ? Patient dad Kayshawn Ozburn called in and stated that the patient did something to his eye not sure what but 3 months ago he scratched the retina and today patient cant keep eye open and no appointment available with PCP or other providers. Please call Tommy at Ph#  [phone]   ?  ? ?Reason for Disposition ? MODERATE eye pain or discomfort (e.g., interferes with normal activities or awakens from sleep; more than mild) ? ?Answer Assessment - Initial Assessment Questions ?1. ONSET: "When did the pain start?" (e.g., minutes, hours, days) ?    Started today but happened 3 months ago ?2. TIMING: "Does the pain come and go, or has it been constant since it started?" (e.g., constant, intermittent, fleeting) ?    constant ?3. SEVERITY: "How bad is the pain?"   (Scale 1-10; mild, moderate or severe) ?  - MILD (1-3): doesn't interfere with normal activities  ?  - MODERATE (4-7): interferes with normal activities or awakens from sleep  ?  - SEVERE (8-10): excruciating pain and patient unable to do normal activities ?    Unable to keep eye open ?4. LOCATION: "Where does it hurt?"  (e.g., eyelid, eye, cheekbone) ?    R eye ?7. EYE DISCHARGE: "Is there any discharge (pus) from the eye(s)?"  If yes, ask: "What  color is it?"  ?    Dad says he thinks he has some drainage  ?9. OTHER SYMPTOMS: "Do you have any other symptoms?" (e.g., headache, nasal discharge, facial rash) ?    Dad wasn't with pt ? ?Protocols used: Eye Pain and Other Symptoms-A-AH ? ?

## 2022-01-09 NOTE — Telephone Encounter (Signed)
[name] spoke with pts dad and stated that he is going to [employer] ?

## 2022-01-15 DIAGNOSIS — H18831 Recurrent erosion of cornea, right eye: Secondary | ICD-10-CM | POA: Diagnosis not present

## 2022-01-17 ENCOUNTER — Encounter: Payer: BC Managed Care – PPO | Admitting: Family Medicine

## 2022-01-30 ENCOUNTER — Encounter: Payer: Self-pay | Admitting: Family Medicine

## 2022-01-30 ENCOUNTER — Ambulatory Visit (INDEPENDENT_AMBULATORY_CARE_PROVIDER_SITE_OTHER): Payer: BC Managed Care – PPO | Admitting: Family Medicine

## 2022-01-30 VITALS — BP 134/80 | HR 78 | Temp 98.3°F | Wt 254.2 lb

## 2022-01-30 DIAGNOSIS — Z136 Encounter for screening for cardiovascular disorders: Secondary | ICD-10-CM | POA: Diagnosis not present

## 2022-01-30 DIAGNOSIS — Z0000 Encounter for general adult medical examination without abnormal findings: Secondary | ICD-10-CM | POA: Diagnosis not present

## 2022-01-30 DIAGNOSIS — I10 Essential (primary) hypertension: Secondary | ICD-10-CM | POA: Diagnosis not present

## 2022-01-30 DIAGNOSIS — E559 Vitamin D deficiency, unspecified: Secondary | ICD-10-CM | POA: Diagnosis not present

## 2022-01-30 DIAGNOSIS — Z23 Encounter for immunization: Secondary | ICD-10-CM

## 2022-01-30 LAB — URINALYSIS, ROUTINE W REFLEX MICROSCOPIC
Bilirubin, UA: NEGATIVE
Glucose, UA: NEGATIVE
Ketones, UA: NEGATIVE
Leukocytes,UA: NEGATIVE
Nitrite, UA: NEGATIVE
Protein,UA: NEGATIVE
RBC, UA: NEGATIVE
Specific Gravity, UA: 1.025 (ref 1.005–1.030)
Urobilinogen, Ur: 0.2 mg/dL (ref 0.2–1.0)
pH, UA: 6 (ref 5.0–7.5)

## 2022-01-30 LAB — MICROALBUMIN, URINE WAIVED
Creatinine, Urine Waived: 100 mg/dL (ref 10–300)
Microalb, Ur Waived: 10 mg/L (ref 0–19)
Microalb/Creat Ratio: <30 mg/g (ref <30)

## 2022-01-30 MED ORDER — LISINOPRIL 20 MG PO TABS: 20.0000 mg | ORAL_TABLET | Freq: Every day | ORAL | 0 refills | Status: DC

## 2022-01-30 NOTE — Progress Notes (Signed)
? ?BP 134/80   Pulse 78   Temp 98.3 ?F (36.8 ?C)   Wt 254 lb 3.2 oz (115.3 kg)   SpO2 98%   BMI 35.45 kg/m?   ? ?Subjective:  ? ? Patient ID: Daniel Martinez, male    DOB: 1991/09/06, 31 y.o.   MRN: [id number] ? ?HPI: ?Daniel Martinez is a 31 y.o. male presenting on 01/30/2022 for comprehensive medical examination. Current medical complaints include: ? ?HYPERTENSION ?Hypertension status: controlled  ?Satisfied with current treatment? yes ?Duration of hypertension: chronic ?BP monitoring frequency:  not checking ?BP medication side effects:  no ?Medication compliance: excellent compliance ?Previous BP meds: lisinopril ?Aspirin: no ?Recurrent headaches: no ?Visual changes: no ?Palpitations: no ?Dyspnea: no ?Chest pain: no ?Lower extremity edema: no ?Dizzy/lightheaded: no ? ?Interim Problems from his last visit: no ? ?Depression Screen done today and results listed below:  ? ?  01/30/2022  ?  9:13 AM 11/18/2021  ?  2:33 PM 01/16/2021  ?  8:08 AM 07/17/2020  ?  9:38 AM 12/27/2019  ?  9:30 AM  ?Depression screen PHQ 2/9  ?Decreased Interest 1 0 0 0 1  ?Down, Depressed, Hopeless 1 0 1 0 1  ?PHQ - 2 Score 2 0 1 0 2  ?Altered sleeping 1 0  0 1  ?Tired, decreased energy 1 0  0 1  ?Change in appetite 0 0  0 0  ?Feeling bad or failure about yourself  0 0  0 0  ?Trouble concentrating 1 0  0 0  ?Moving slowly or fidgety/restless 0 0  0 0  ?Suicidal thoughts 0 0  0 0  ?PHQ-9 Score 5 0  0 4  ?Difficult doing work/chores    Not difficult at all Not difficult at all  ? ? ?Past Medical History:  ?Past Medical History:  ?Diagnosis Date  ? Hypertension   ? ? ?Surgical History:  ?Past Surgical History:  ?Procedure Laterality Date  ? [place] EXTRACTION Bilateral 2021  ? ? ?Medications:  ?Current Outpatient Medications on File Prior to Visit  ?Medication Sig  ? FIBER PO Take by mouth.  ? ?No current facility-administered medications on file prior to visit.  ? ? ?Allergies:  ?No Known Allergies ? ?Social History:  ?Social History   ? ?Socioeconomic History  ? Marital status: Single  ?  Spouse name: Not on file  ? Number of children: Not on file  ? Years of education: Not on file  ? Highest education level: Not on file  ?Occupational History  ? Not on file  ?Tobacco Use  ? Smoking status: Never  ? Smokeless tobacco: Never  ?Vaping Use  ? Vaping Use: Never used  ?Substance and Sexual Activity  ? Alcohol use: Yes  ?  Comment: rare  ? Drug use: No  ? Sexual activity: Not Currently  ?Other Topics Concern  ? Not on file  ?Social History Narrative  ? Not on file  ? ?Social Determinants of Health  ? ?Financial Resource Strain: Not on file  ?Food Insecurity: Not on file  ?Transportation Needs: Not on file  ?Physical Activity: Not on file  ?Stress: Not on file  ?Social Connections: Not on file  ?Intimate Partner Violence: Not on file  ? ?Social History  ? ?Tobacco Use  ?Smoking Status Never  ?Smokeless Tobacco Never  ? ?Social History  ? ?Substance and Sexual Activity  ?Alcohol Use Yes  ? Comment: rare  ? ? ?Family History:  ?Family History  ?Problem Relation  Age of Onset  ? Hypertension Mother   ? Diabetes Mother   ? Hyperlipidemia Father   ? ? ?Past medical history, surgical history, medications, allergies, family history and social history reviewed with patient today and changes made to appropriate areas of the chart.  ? ?Review of Systems  ?Constitutional: Negative.   ?HENT: Negative.    ?Eyes:  Positive for blurred vision. Negative for double vision, photophobia, pain, discharge and redness.  ?Respiratory:  Positive for shortness of breath. Negative for cough, hemoptysis, sputum production and wheezing.   ?Cardiovascular:  Positive for palpitations. Negative for chest pain, orthopnea, claudication, leg swelling and PND.  ?Gastrointestinal:  Positive for diarrhea and heartburn. Negative for abdominal pain, blood in stool, constipation, melena, nausea and vomiting.  ?Genitourinary: Negative.   ?Musculoskeletal: Negative.   ?Skin:  Positive for rash.  Negative for itching.  ?Neurological: Negative.   ?Endo/Heme/Allergies: Negative.   ?Psychiatric/Behavioral: Negative.    ?All other ROS negative except what is listed above and in the HPI.  ? ?   ?Objective:  ?  ?BP 134/80   Pulse 78   Temp 98.3 ?F (36.8 ?C)   Wt 254 lb 3.2 oz (115.3 kg)   SpO2 98%   BMI 35.45 kg/m?   ?Wt Readings from Last 3 Encounters:  ?01/30/22 254 lb 3.2 oz (115.3 kg)  ?07/17/21 253 lb (114.8 kg)  ?01/16/21 253 lb 3.2 oz (114.9 kg)  ?  ?Physical Exam ?Vitals and nursing note reviewed.  ?Constitutional:   ?   General: He is not in acute distress. ?   Appearance: Normal appearance. He is not ill-appearing, toxic-appearing or diaphoretic.  ?HENT:  ?   Head: Normocephalic and atraumatic.  ?   Right Ear: External ear normal.  ?   Left Ear: External ear normal.  ?   Nose: Nose normal.  ?   Mouth/Throat:  ?   Mouth: Mucous membranes are moist.  ?   Pharynx: Oropharynx is clear.  ?Eyes:  ?   General: No scleral icterus.    ?   Right eye: No discharge.     ?   Left eye: No discharge.  ?   Extraocular Movements: Extraocular movements intact.  ?   Conjunctiva/sclera: Conjunctivae normal.  ?   Pupils: Pupils are equal, round, and reactive to light.  ?Cardiovascular:  ?   Rate and Rhythm: Normal rate and regular rhythm.  ?   Pulses: Normal pulses.  ?   Heart sounds: Normal heart sounds. No murmur heard. ?  No friction rub. No gallop.  ?Pulmonary:  ?   Effort: Pulmonary effort is normal. No respiratory distress.  ?   Breath sounds: Normal breath sounds. No stridor. No wheezing, rhonchi or rales.  ?Chest:  ?   Chest wall: No tenderness.  ?Musculoskeletal:     ?   General: Normal range of motion.  ?   Cervical back: Normal range of motion and neck supple.  ?Skin: ?   General: Skin is warm and dry.  ?   Capillary Refill: Capillary refill takes less than 2 seconds.  ?   Coloration: Skin is not jaundiced or pale.  ?   Findings: No bruising, erythema, lesion or rash.  ?Neurological:  ?   General: No focal  deficit present.  ?   Mental Status: He is alert and oriented to person, place, and time. Mental status is at baseline.  ?Psychiatric:     ?   Mood and Affect: Mood normal.     ?  Behavior: Behavior normal.     ?   Thought Content: Thought content normal.     ?   Judgment: Judgment normal.  ? ? ?Results for orders placed or performed in visit on 07/17/21  ?Basic metabolic panel  ?Result Value Ref Range  ? Glucose 94 70 - 99 mg/dL  ? BUN 8 6 - 20 mg/dL  ? Creatinine, Ser 0.95 0.76 - 1.27 mg/dL  ? eGFR 110 >59 mL/min/1.73  ? BUN/Creatinine Ratio 8 (L) 9 - 20  ? Sodium 135 134 - 144 mmol/L  ? Potassium 4.4 3.5 - 5.2 mmol/L  ? Chloride 96 96 - 106 mmol/L  ? CO2 25 20 - 29 mmol/L  ? Calcium 10.0 8.7 - 10.2 mg/dL  ? ?   ?Assessment & Plan:  ? ?Problem List Items Addressed This Visit   ? ?  ? Cardiovascular and Mediastinum  ? Essential hypertension  ?  Under good control on current regimen. Continue current regimen. Continue to monitor. Call with any concerns. Refills given. Labs drawn today. ? ? ?  ?  ? Relevant Medications  ? lisinopril (ZESTRIL) 20 MG tablet (Start on 04/04/2022)  ? Other Relevant Orders  ? Microalbumin, Urine Waived  ? ?Other Visit Diagnoses   ? ? Routine general medical examination at a health care facility    -  Primary  ? Vaccines updated. Screening labs checked today. Continue diet and exercise. Call with any concerns.   ? Relevant Orders  ? Comprehensive metabolic panel  ? CBC with Differential/Platelet  ? Lipid Panel w/o Chol/HDL Ratio  ? TSH  ? Urinalysis, Routine w reflex microscopic  ? Vitamin D deficiency      ? Rechecking labs today. Await results.   ? Relevant Orders  ? VITAMIN D 25 Hydroxy (Vit-D Deficiency, Fractures)  ? ?  ?  ?LABORATORY TESTING:  ?Health maintenance labs ordered today as discussed above.  ? ?IMMUNIZATIONS:   ?- Tdap: Tetanus vaccination status reviewed: Tdap vaccination indicated and given today. ?- Influenza: Postponed to flu season ?- Pneumovax: Not applicable ?-  Prevnar: Not applicable ?- COVID: Up to date ? ?PATIENT COUNSELING:   ? ?Sexuality: Discussed sexually transmitted diseases, partner selection, use of condoms, avoidance of unintended pregnancy  and contraceptive alt

## 2022-01-30 NOTE — Assessment & Plan Note (Signed)
Under good control on current regimen. Continue current regimen. Continue to monitor. Call with any concerns. Refills given. Labs drawn today.   

## 2022-01-31 ENCOUNTER — Encounter: Payer: Self-pay | Admitting: Family Medicine

## 2022-01-31 LAB — CBC WITH DIFFERENTIAL/PLATELET
Basophils Absolute: 0 10*3/uL (ref 0.0–0.2)
Basos: 1 %
EOS (ABSOLUTE): 0.6 10*3/uL — ABNORMAL HIGH (ref 0.0–0.4)
Eos: 8 %
Hematocrit: 45.8 % (ref 37.5–51.0)
Hemoglobin: 15.4 g/dL (ref 13.0–17.7)
Immature Grans (Abs): 0 10*3/uL (ref 0.0–0.1)
Immature Granulocytes: 0 %
Lymphocytes Absolute: 1.9 10*3/uL (ref 0.7–3.1)
Lymphs: 26 %
MCH: 29.1 pg (ref 26.6–33.0)
MCHC: 33.6 g/dL (ref 31.5–35.7)
MCV: 86 fL (ref 79–97)
Monocytes Absolute: 0.6 10*3/uL (ref 0.1–0.9)
Monocytes: 8 %
Neutrophils Absolute: 4.2 10*3/uL (ref 1.4–7.0)
Neutrophils: 57 %
Platelets: 302 10*3/uL (ref 150–450)
RBC: 5.3 x10E6/uL (ref 4.14–5.80)
RDW: 13 % (ref 11.6–15.4)
WBC: 7.4 10*3/uL (ref 3.4–10.8)

## 2022-01-31 LAB — VITAMIN D 25 HYDROXY (VIT D DEFICIENCY, FRACTURES): Vit D, 25-Hydroxy: 23.6 ng/mL — ABNORMAL LOW (ref 30.0–100.0)

## 2022-01-31 LAB — COMPREHENSIVE METABOLIC PANEL
ALT: 36 IU/L (ref 0–44)
AST: 20 IU/L (ref 0–40)
Albumin/Globulin Ratio: 1.6 (ref 1.2–2.2)
Albumin: 4.6 g/dL (ref 4.1–5.2)
Alkaline Phosphatase: 79 IU/L (ref 44–121)
BUN/Creatinine Ratio: 13 (ref 9–20)
BUN: 13 mg/dL (ref 6–20)
Bilirubin Total: 0.5 mg/dL (ref 0.0–1.2)
CO2: 23 mmol/L (ref 20–29)
Calcium: 9.7 mg/dL (ref 8.7–10.2)
Chloride: 99 mmol/L (ref 96–106)
Creatinine, Ser: 1.01 mg/dL (ref 0.76–1.27)
Globulin, Total: 2.9 g/dL (ref 1.5–4.5)
Glucose: 99 mg/dL (ref 70–99)
Potassium: 4.2 mmol/L (ref 3.5–5.2)
Sodium: 138 mmol/L (ref 134–144)
Total Protein: 7.5 g/dL (ref 6.0–8.5)
eGFR: 103 mL/min/{1.73_m2} (ref >59)

## 2022-01-31 LAB — LIPID PANEL W/O CHOL/HDL RATIO
Cholesterol, Total: 193 mg/dL (ref 100–199)
HDL: 50 mg/dL (ref >39)
LDL Chol Calc (NIH): 118 mg/dL — ABNORMAL HIGH (ref 0–99)
Triglycerides: 142 mg/dL (ref 0–149)
VLDL Cholesterol Cal: 25 mg/dL (ref 5–40)

## 2022-01-31 LAB — TSH: TSH: 1.28 u[IU]/mL (ref 0.450–4.500)

## 2022-02-07 DIAGNOSIS — H18831 Recurrent erosion of cornea, right eye: Secondary | ICD-10-CM | POA: Diagnosis not present

## 2022-02-25 ENCOUNTER — Encounter: Payer: Self-pay | Admitting: Family Medicine

## 2022-03-06 ENCOUNTER — Encounter: Payer: Self-pay | Admitting: Family Medicine

## 2022-03-06 ENCOUNTER — Ambulatory Visit
Admission: RE | Admit: 2022-03-06 | Discharge: 2022-03-06 | Disposition: A | Discharge status: Home / Self Care | Payer: BC Managed Care – PPO | Attending: Family Medicine | Admitting: Family Medicine

## 2022-03-06 ENCOUNTER — Ambulatory Visit: Payer: BC Managed Care – PPO | Admitting: Family Medicine

## 2022-03-06 ENCOUNTER — Ambulatory Visit
Admission: RE | Admit: 2022-03-06 | Discharge: 2022-03-06 | Disposition: A | Discharge status: Home / Self Care | Payer: BC Managed Care – PPO | Source: Ambulatory Visit | Attending: Family Medicine | Admitting: Family Medicine

## 2022-03-06 VITALS — BP 134/72 | HR 81 | Temp 98.0°F | Wt 262.0 lb

## 2022-03-06 DIAGNOSIS — M79675 Pain in left toe(s): Secondary | ICD-10-CM | POA: Diagnosis not present

## 2022-03-06 DIAGNOSIS — M7989 Other specified soft tissue disorders: Secondary | ICD-10-CM | POA: Diagnosis not present

## 2022-03-06 MED ORDER — HYDROCHLOROTHIAZIDE 25 MG PO TABS: 25.0000 mg | ORAL_TABLET | Freq: Every day | ORAL | 3 refills | Status: DC

## 2022-03-06 NOTE — Progress Notes (Signed)
BP 134/72   Pulse 81   Temp 98 F (36.7 C)   Wt 262 lb (118.8 kg)   SpO2 97%   BMI 36.54 kg/m    Subjective:    Patient ID: Daniel Martinez, male    DOB: [birth year], 31 y.o.   MRN: [id number]  HPI: Daniel Martinez is a 31 y.o. male  Chief Complaint  Patient presents with   Edema    Patient states he has swelling in his big toe and has pain on the top part of his foot from his toes to his ankle. Patient states pain and swelling have been going on for months.    Has been having off and on swelling in his L calf and great toe for several months. Worse when he's on his feet, better when he's resting. BP has been running well. No SOB. No redness. No tenderness. He's concerned about a clot.  No other concerns or complaints at this time.   Relevant past medical, surgical, family and social history reviewed and updated as indicated. Interim medical history since our last visit reviewed. Allergies and medications reviewed and updated.  Review of Systems  Constitutional: Negative.   Respiratory: Negative.    Cardiovascular:  Positive for leg swelling. Negative for chest pain and palpitations.  Gastrointestinal: Negative.   Musculoskeletal: Negative.   Psychiatric/Behavioral: Negative.     Per HPI unless specifically indicated above     Objective:    BP 134/72   Pulse 81   Temp 98 F (36.7 C)   Wt 262 lb (118.8 kg)   SpO2 97%   BMI 36.54 kg/m   Wt Readings from Last 3 Encounters:  03/06/22 262 lb (118.8 kg)  01/30/22 254 lb 3.2 oz (115.3 kg)  07/17/21 253 lb (114.8 kg)    Physical Exam Vitals and nursing note reviewed.  Constitutional:      General: He is not in acute distress.    Appearance: Normal appearance. He is obese. He is not ill-appearing, toxic-appearing or diaphoretic.  HENT:     Head: Normocephalic and atraumatic.     Right Ear: External ear normal.     Left Ear: External ear normal.     Nose: Nose normal.     Mouth/Throat:     Mouth: Mucous  membranes are moist.     Pharynx: Oropharynx is clear.  Eyes:     General: No scleral icterus.       Right eye: No discharge.        Left eye: No discharge.     Extraocular Movements: Extraocular movements intact.     Conjunctiva/sclera: Conjunctivae normal.     Pupils: Pupils are equal, round, and reactive to light.  Cardiovascular:     Rate and Rhythm: Normal rate and regular rhythm.     Pulses: Normal pulses.     Heart sounds: Normal heart sounds. No murmur heard.   No friction rub. No gallop.  Pulmonary:     Effort: Pulmonary effort is normal. No respiratory distress.     Breath sounds: Normal breath sounds. No stridor. No wheezing, rhonchi or rales.  Chest:     Chest wall: No tenderness.  Musculoskeletal:        General: Swelling (trace, in calf and L great toe, no redness or tenderness) present. Normal range of motion.     Cervical back: Normal range of motion and neck supple.     Right lower leg: No edema.  Left lower leg: No edema.  Skin:    General: Skin is warm and dry.     Capillary Refill: Capillary refill takes less than 2 seconds.     Coloration: Skin is not jaundiced or pale.     Findings: No bruising, erythema, lesion or rash.  Neurological:     General: No focal deficit present.     Mental Status: He is alert and oriented to person, place, and time. Mental status is at baseline.  Psychiatric:        Mood and Affect: Mood normal.        Behavior: Behavior normal.        Thought Content: Thought content normal.        Judgment: Judgment normal.    Results for orders placed or performed in visit on 01/30/22  Comprehensive metabolic panel  Result Value Ref Range   Glucose 99 70 - 99 mg/dL   BUN 13 6 - 20 mg/dL   Creatinine, Ser 1.01 0.76 - 1.27 mg/dL   eGFR 103 >59 mL/min/1.73   BUN/Creatinine Ratio 13 9 - 20   Sodium 138 134 - 144 mmol/L   Potassium 4.2 3.5 - 5.2 mmol/L   Chloride 99 96 - 106 mmol/L   CO2 23 20 - 29 mmol/L   Calcium 9.7 8.7 - 10.2  mg/dL   Total Protein 7.5 6.0 - 8.5 g/dL   Albumin 4.6 4.1 - 5.2 g/dL   Globulin, Total 2.9 1.5 - 4.5 g/dL   Albumin/Globulin Ratio 1.6 1.2 - 2.2   Bilirubin Total 0.5 0.0 - 1.2 mg/dL   Alkaline Phosphatase 79 44 - 121 IU/L   AST 20 0 - 40 IU/L   ALT 36 0 - 44 IU/L  CBC with Differential/Platelet  Result Value Ref Range   WBC 7.4 3.4 - 10.8 x10E3/uL   RBC 5.30 4.14 - 5.80 x10E6/uL   Hemoglobin 15.4 13.0 - 17.7 g/dL   Hematocrit 45.8 37.5 - 51.0 %   MCV 86 79 - 97 fL   MCH 29.1 26.6 - 33.0 pg   MCHC 33.6 31.5 - 35.7 g/dL   RDW 13.0 11.6 - 15.4 %   Platelets 302 150 - 450 x10E3/uL   Neutrophils 57 Not Estab. %   Lymphs 26 Not Estab. %   Monocytes 8 Not Estab. %   Eos 8 Not Estab. %   Basos 1 Not Estab. %   Neutrophils Absolute 4.2 1.4 - 7.0 x10E3/uL   Lymphocytes Absolute 1.9 0.7 - 3.1 x10E3/uL   Monocytes Absolute 0.6 0.1 - 0.9 x10E3/uL   EOS (ABSOLUTE) 0.6 (H) 0.0 - 0.4 x10E3/uL   Basophils Absolute 0.0 0.0 - 0.2 x10E3/uL   Immature Granulocytes 0 Not Estab. %   Immature Grans (Abs) 0.0 0.0 - 0.1 x10E3/uL  Lipid Panel w/o Chol/HDL Ratio  Result Value Ref Range   Cholesterol, Total 193 100 - 199 mg/dL   Triglycerides 142 0 - 149 mg/dL   HDL 50 >39 mg/dL   VLDL Cholesterol Cal 25 5 - 40 mg/dL   LDL Chol Calc (NIH) 118 (H) 0 - 99 mg/dL  TSH  Result Value Ref Range   TSH 1.280 0.450 - 4.500 uIU/mL  Urinalysis, Routine w reflex microscopic  Result Value Ref Range   Specific Gravity, UA 1.025 1.005 - 1.030   pH, UA 6.0 5.0 - 7.5   Color, UA Yellow Yellow   Appearance Ur Clear Clear   Leukocytes,UA Negative Negative   Protein,UA Negative Negative/Trace  Glucose, UA Negative Negative   Ketones, UA Negative Negative   RBC, UA Negative Negative   Bilirubin, UA Negative Negative   Urobilinogen, Ur 0.2 0.2 - 1.0 mg/dL   Nitrite, UA Negative Negative  Microalbumin, Urine Waived  Result Value Ref Range   Microalb, Ur Waived 10 0 - 19 mg/L   Creatinine, Urine Waived 100  10 - 300 mg/dL   Microalb/Creat Ratio <30 <30 mg/g  VITAMIN D 25 Hydroxy (Vit-D Deficiency, Fractures)  Result Value Ref Range   Vit D, 25-Hydroxy 23.6 (L) 30.0 - 100.0 ng/mL      Assessment & Plan:   Problem List Items Addressed This Visit   None Visit Diagnoses     Pain of toe of left foot    -  Primary   Concern for arthritis. Will check x-ray. Await results. Treat as needed.    Relevant Orders   Comprehensive metabolic panel   CBC with Differential/Platelet   Uric acid   DG Foot Complete Left   Swelling of lower leg       Will check labs and [place] to r/o DVT. Start HCTZ. Recheck 3-4 weeks.    Relevant Orders   US Venous Img Lower Unilateral Left        Follow up plan: Return 3-4 weeks.

## 2022-03-07 ENCOUNTER — Ambulatory Visit
Admission: RE | Admit: 2022-03-07 | Discharge: 2022-03-07 | Disposition: A | Discharge status: Home / Self Care | Payer: BC Managed Care – PPO | Source: Ambulatory Visit | Attending: Family Medicine | Admitting: Family Medicine

## 2022-03-07 DIAGNOSIS — M7989 Other specified soft tissue disorders: Secondary | ICD-10-CM | POA: Diagnosis not present

## 2022-03-07 LAB — COMPREHENSIVE METABOLIC PANEL
ALT: 58 IU/L — ABNORMAL HIGH (ref 0–44)
AST: 34 IU/L (ref 0–40)
Albumin/Globulin Ratio: 1.6 (ref 1.2–2.2)
Albumin: 4.7 g/dL (ref 4.1–5.2)
Alkaline Phosphatase: 76 IU/L (ref 44–121)
BUN/Creatinine Ratio: 11 (ref 9–20)
BUN: 9 mg/dL (ref 6–20)
Bilirubin Total: 0.4 mg/dL (ref 0.0–1.2)
CO2: 25 mmol/L (ref 20–29)
Calcium: 9.9 mg/dL (ref 8.7–10.2)
Chloride: 99 mmol/L (ref 96–106)
Creatinine, Ser: 0.83 mg/dL (ref 0.76–1.27)
Globulin, Total: 2.9 g/dL (ref 1.5–4.5)
Glucose: 97 mg/dL (ref 70–99)
Potassium: 4.6 mmol/L (ref 3.5–5.2)
Sodium: 139 mmol/L (ref 134–144)
Total Protein: 7.6 g/dL (ref 6.0–8.5)
eGFR: 121 mL/min/{1.73_m2} (ref >59)

## 2022-03-07 LAB — CBC WITH DIFFERENTIAL/PLATELET
Basophils Absolute: 0.1 10*3/uL (ref 0.0–0.2)
Basos: 1 %
EOS (ABSOLUTE): 0.5 10*3/uL — ABNORMAL HIGH (ref 0.0–0.4)
Eos: 7 %
Hematocrit: 44.9 % (ref 37.5–51.0)
Hemoglobin: 15.1 g/dL (ref 13.0–17.7)
Immature Grans (Abs): 0.1 10*3/uL (ref 0.0–0.1)
Immature Granulocytes: 1 %
Lymphocytes Absolute: 1.7 10*3/uL (ref 0.7–3.1)
Lymphs: 23 %
MCH: 28.7 pg (ref 26.6–33.0)
MCHC: 33.6 g/dL (ref 31.5–35.7)
MCV: 85 fL (ref 79–97)
Monocytes Absolute: 0.6 10*3/uL (ref 0.1–0.9)
Monocytes: 8 %
Neutrophils Absolute: 4.7 10*3/uL (ref 1.4–7.0)
Neutrophils: 60 %
Platelets: 282 10*3/uL (ref 150–450)
RBC: 5.26 x10E6/uL (ref 4.14–5.80)
RDW: 12.9 % (ref 11.6–15.4)
WBC: 7.7 10*3/uL (ref 3.4–10.8)

## 2022-03-07 LAB — URIC ACID: Uric Acid: 5 mg/dL (ref 3.8–8.4)

## 2022-03-07 NOTE — Progress Notes (Signed)
Hi Daniel Martinez.  Your lab work looks good.  No concerns at this time. Continue with your current medication regimen.  Follow up as discussed.  Please let me know if you have any questions.

## 2022-03-07 NOTE — Progress Notes (Signed)
Hi Glover.  Your ultrasound of your lower leg is negative.  Follow up as discussed with Dr. Laural Benes.

## 2022-03-09 NOTE — Progress Notes (Signed)
Please let patient know that his foot xray is normal.  There is minimal arthritis around the great toe.  No other concerns at this time.

## 2022-03-24 ENCOUNTER — Other Ambulatory Visit: Payer: Self-pay | Admitting: Family Medicine

## 2022-03-24 NOTE — Telephone Encounter (Signed)
Requested medications are due for refill today.  unsure  Requested medications are on the active medications list.  yes  Last refill. 04/04/2022 #90 0 refills  Future visit scheduled.   yes  Notes to clinic.  It appears that the next refill is already written ( 04/04/2022 #90).    Requested Prescriptions  Pending Prescriptions Disp Refills   lisinopril (ZESTRIL) 20 MG tablet [Pharmacy Med Name: LISINOPRIL 20MG  TABLETS] 90 tablet 0    Sig: TAKE 1 TABLET(20 MG) BY MOUTH DAILY     Cardiovascular:  ACE Inhibitors Passed - 03/24/2022  9:12 AM      Passed - Cr in normal range and within 180 days    Creatinine, Ser  Date Value Ref Range Status  03/06/2022 0.83 0.76 - 1.27 mg/dL Final         Passed - K in normal range and within 180 days    Potassium  Date Value Ref Range Status  03/06/2022 4.6 3.5 - 5.2 mmol/L Final         Passed - Patient is not pregnant      Passed - Last BP in normal range    BP Readings from Last 1 Encounters:  03/06/22 134/72         Passed - Valid encounter within last 6 months    Recent Outpatient Visits           2 weeks ago Pain of toe of left foot   [facility] [place], Megan P, DO   1 month ago Routine general medical examination at a health care facility   [facility] [place], Megan P, DO   4 months ago COVID   [facility], Avanti, MD   8 months ago Essential hypertension   [facility] [place], Megan P, DO   1 year ago Routine general medical examination at a health care facility   [facility] [place], [place] REM[facility], DO       Future Appointments             In 3 days [facility], Laural Benes, DO [facility]   In 4 months [employer], Laural Benes, DO [facility]

## 2022-03-27 ENCOUNTER — Encounter: Payer: Self-pay | Admitting: Family Medicine

## 2022-03-27 ENCOUNTER — Ambulatory Visit: Payer: BC Managed Care – PPO | Admitting: Family Medicine

## 2022-03-27 VITALS — BP 134/80 | HR 76 | Temp 98.2°F | Wt 253.0 lb

## 2022-03-27 DIAGNOSIS — I10 Essential (primary) hypertension: Secondary | ICD-10-CM

## 2022-03-27 MED ORDER — LISINOPRIL-HYDROCHLOROTHIAZIDE 20-25 MG PO TABS: 1.0000 | ORAL_TABLET | Freq: Every day | ORAL | 1 refills | Status: DC

## 2022-03-27 NOTE — Progress Notes (Signed)
BP 134/80   Pulse 76   Temp 98.2 F (36.8 C)   Wt 253 lb (114.8 kg)   SpO2 99%   BMI 35.29 kg/m    Subjective:    Patient ID: Daniel Martinez, male    DOB: [birth year], 31 y.o.   MRN: [id number]  HPI: Daniel Martinez is a 31 y.o. male  Chief Complaint  Patient presents with   Edema    Patient states he thinks its gone down some   HYPERTENSION Hypertension status: controlled  Satisfied with current treatment? no Duration of hypertension: chronic BP monitoring frequency:  not checking BP medication side effects:  no Medication compliance: excellent compliance Previous BP meds:lisinopril, HCTZ Aspirin: no Recurrent headaches: no Visual changes: no Palpitations: no Dyspnea: no Chest pain: no Lower extremity edema: no- it has resolved on the HCTZ. Dizzy/lightheaded: no  Relevant past medical, surgical, family and social history reviewed and updated as indicated. Interim medical history since our last visit reviewed. Allergies and medications reviewed and updated.  Review of Systems  Constitutional: Negative.   Respiratory: Negative.    Cardiovascular: Negative.   Gastrointestinal: Negative.   Musculoskeletal: Negative.   Neurological: Negative.   Psychiatric/Behavioral: Negative.      Per HPI unless specifically indicated above     Objective:    BP 134/80   Pulse 76   Temp 98.2 F (36.8 C)   Wt 253 lb (114.8 kg)   SpO2 99%   BMI 35.29 kg/m   Wt Readings from Last 3 Encounters:  03/27/22 253 lb (114.8 kg)  03/06/22 262 lb (118.8 kg)  01/30/22 254 lb 3.2 oz (115.3 kg)    Physical Exam Vitals and nursing note reviewed.  Constitutional:      General: He is not in acute distress.    Appearance: Normal appearance. He is normal weight. He is not ill-appearing, toxic-appearing or diaphoretic.  HENT:     Head: Normocephalic and atraumatic.     Right Ear: External ear normal.     Left Ear: External ear normal.     Nose: Nose normal.      Mouth/Throat:     Mouth: Mucous membranes are moist.     Pharynx: Oropharynx is clear.  Eyes:     General: No scleral icterus.       Right eye: No discharge.        Left eye: No discharge.     Extraocular Movements: Extraocular movements intact.     Conjunctiva/sclera: Conjunctivae normal.     Pupils: Pupils are equal, round, and reactive to light.  Cardiovascular:     Rate and Rhythm: Normal rate and regular rhythm.     Pulses: Normal pulses.     Heart sounds: Normal heart sounds. No murmur heard.    No friction rub. No gallop.  Pulmonary:     Effort: Pulmonary effort is normal. No respiratory distress.     Breath sounds: Normal breath sounds. No stridor. No wheezing, rhonchi or rales.  Chest:     Chest wall: No tenderness.  Musculoskeletal:        General: Normal range of motion.     Cervical back: Normal range of motion and neck supple.  Skin:    General: Skin is warm and dry.     Capillary Refill: Capillary refill takes less than 2 seconds.     Coloration: Skin is not jaundiced or pale.     Findings: No bruising, erythema, lesion or rash.  Neurological:  General: No focal deficit present.     Mental Status: He is alert and oriented to person, place, and time. Mental status is at baseline.  Psychiatric:        Mood and Affect: Mood normal.        Behavior: Behavior normal.        Thought Content: Thought content normal.        Judgment: Judgment normal.     Results for orders placed or performed in visit on 03/06/22  Comprehensive metabolic panel  Result Value Ref Range   Glucose 97 70 - 99 mg/dL   BUN 9 6 - 20 mg/dL   Creatinine, Ser 1.61 0.76 - 1.27 mg/dL   eGFR 096 >04 VW/UJW/1.19   BUN/Creatinine Ratio 11 9 - 20   Sodium 139 134 - 144 mmol/L   Potassium 4.6 3.5 - 5.2 mmol/L   Chloride 99 96 - 106 mmol/L   CO2 25 20 - 29 mmol/L   Calcium 9.9 8.7 - 10.2 mg/dL   Total Protein 7.6 6.0 - 8.5 g/dL   Albumin 4.7 4.1 - 5.2 g/dL   Globulin, Total 2.9 1.5 - 4.5  g/dL   Albumin/Globulin Ratio 1.6 1.2 - 2.2   Bilirubin Total 0.4 0.0 - 1.2 mg/dL   Alkaline Phosphatase 76 44 - 121 IU/L   AST 34 0 - 40 IU/L   ALT 58 (H) 0 - 44 IU/L  CBC with Differential/Platelet  Result Value Ref Range   WBC 7.7 3.4 - 10.8 x10E3/uL   RBC 5.26 4.14 - 5.80 x10E6/uL   Hemoglobin 15.1 13.0 - 17.7 g/dL   Hematocrit 14.7 82.9 - 51.0 %   MCV 85 79 - 97 fL   MCH 28.7 26.6 - 33.0 pg   MCHC 33.6 31.5 - 35.7 g/dL   RDW 56.2 13.0 - 86.5 %   Platelets 282 150 - 450 x10E3/uL   Neutrophils 60 Not Estab. %   Lymphs 23 Not Estab. %   Monocytes 8 Not Estab. %   Eos 7 Not Estab. %   Basos 1 Not Estab. %   Neutrophils Absolute 4.7 1.4 - 7.0 x10E3/uL   Lymphocytes Absolute 1.7 0.7 - 3.1 x10E3/uL   Monocytes Absolute 0.6 0.1 - 0.9 x10E3/uL   EOS (ABSOLUTE) 0.5 (H) 0.0 - 0.4 x10E3/uL   Basophils Absolute 0.1 0.0 - 0.2 x10E3/uL   Immature Granulocytes 1 Not Estab. %   Immature Grans (Abs) 0.1 0.0 - 0.1 x10E3/uL  Uric acid  Result Value Ref Range   Uric Acid 5.0 3.8 - 8.4 mg/dL      Assessment & Plan:   Problem List Items Addressed This Visit       Cardiovascular and Mediastinum   Essential hypertension - Primary    Under good control on current regimen. Continue current regimen. Continue to monitor. Call with any concerns. Meds combined. Refills given. BMP checked today.        Relevant Medications   lisinopril-hydrochlorothiazide (ZESTORETIC) 20-25 MG tablet   Other Relevant Orders   Basic metabolic panel     Follow up plan: Return in about 6 months (around 09/26/2022).

## 2022-03-28 LAB — BASIC METABOLIC PANEL
BUN/Creatinine Ratio: 15 (ref 9–20)
BUN: 15 mg/dL (ref 6–20)
CO2: 26 mmol/L (ref 20–29)
Calcium: 9.6 mg/dL (ref 8.7–10.2)
Chloride: 94 mmol/L — ABNORMAL LOW (ref 96–106)
Creatinine, Ser: 0.97 mg/dL (ref 0.76–1.27)
Glucose: 94 mg/dL (ref 70–99)
Potassium: 4.1 mmol/L (ref 3.5–5.2)
Sodium: 135 mmol/L (ref 134–144)
eGFR: 108 mL/min/{1.73_m2} (ref >59)

## 2022-05-15 ENCOUNTER — Encounter: Payer: Self-pay | Admitting: Family Medicine

## 2022-05-15 ENCOUNTER — Ambulatory Visit: Payer: BC Managed Care – PPO | Admitting: Family Medicine

## 2022-05-15 VITALS — BP 123/77 | HR 85 | Temp 98.0°F | Wt 254.4 lb

## 2022-05-15 DIAGNOSIS — K625 Hemorrhage of anus and rectum: Secondary | ICD-10-CM | POA: Diagnosis not present

## 2022-05-15 DIAGNOSIS — F419 Anxiety disorder, unspecified: Secondary | ICD-10-CM | POA: Diagnosis not present

## 2022-05-15 DIAGNOSIS — F429 Obsessive-compulsive disorder, unspecified: Secondary | ICD-10-CM | POA: Insufficient documentation

## 2022-05-15 MED ORDER — SERTRALINE HCL 25 MG PO TABS: ORAL_TABLET | ORAL | 3 refills | Status: DC

## 2022-05-15 MED ORDER — HYDROCORTISONE ACETATE 25 MG RE SUPP: 25.0000 mg | Freq: Two times a day (BID) | RECTAL | 0 refills | Status: DC

## 2022-05-15 NOTE — Progress Notes (Signed)
BP 123/77   Pulse 85   Temp 98 F (36.7 C)   Wt 254 lb 6.4 oz (115.4 kg)   SpO2 98%   BMI 35.48 kg/m    Subjective:    Patient ID: Daniel Martinez, male    DOB: [birth year], 31 y.o.   MRN: [id number]  HPI: Daniel Martinez is a 31 y.o. male  Chief Complaint  Patient presents with   Rectal Bleeding    Patient states he has noticed some blood in his stool, that was was bright red. Patient states it has been making him very anxious.   RECTAL BLEEDING Duration: off and on for months, worse in the past couple of days Bright red rectal bleeding: yes  Amount of blood: spotting  Frequency: occasionally Melena: maybe this time  Spotting on toilet tissue: yes  Anal fullness: yes  Perianal pain: no  Severity: mild Perianal irritation/itching: no  Constipation: no  Chronic straining/valsava:  no  Anal trauma/intercourse: no  Hemorrhoids: yes  Previous colonoscopy: no   ANXIETY/STRESS Duration: chronic but worsening Status:worse Anxious mood: yes  Excessive worrying: yes Irritability: no  Sweating: no Nausea: yes Palpitations:yes Hyperventilation: no Panic attacks: no Agoraphobia: no  Obscessions/compulsions: no Depressed mood: yes    05/15/2022   10:23 AM 03/27/2022    9:07 AM 03/06/2022    2:32 PM 01/30/2022    9:13 AM 11/18/2021    2:33 PM  Depression screen PHQ 2/9  Decreased Interest '1 2 1 1 ' 0  Down, Depressed, Hopeless '1 2 1 1 ' 0  PHQ - 2 Score '2 4 2 2 ' 0  Altered sleeping '2 2 2 1 ' 0  Tired, decreased energy '2 2 2 1 ' 0  Change in appetite 0 2 1 0 0  Feeling bad or failure about yourself  '1 2 1 ' 0 0  Trouble concentrating '1 2 1 1 ' 0  Moving slowly or fidgety/restless '1 2 1 ' 0 0  Suicidal thoughts 0 0 0 0 0  PHQ-9 Score '9 16 10 5 ' 0  Difficult doing work/chores Somewhat difficult Somewhat difficult         05/15/2022   10:23 AM 03/27/2022    9:07 AM 03/06/2022    2:31 PM 01/30/2022    9:13 AM  GAD 7 : Generalized Anxiety Score  Nervous, Anxious, on Edge '3 2 2 1   ' Control/stop worrying '3 2 2 1  ' Worry too much - different things '3 2 2 ' 0  Trouble relaxing '3 2 2 1  ' Restless '3 2 2 1  ' Easily annoyed or irritable '3 2 2 1  ' Afraid - awful might happen '3 2 2 1  ' Total GAD 7 Score '21 14 14 6  ' Anxiety Difficulty Somewhat difficult Somewhat difficult Somewhat difficult Not difficult at all   Anhedonia: no Weight changes: no Insomnia: no   Hypersomnia: no Fatigue/loss of energy: yes Feelings of worthlessness: yes Feelings of guilt: yes Impaired concentration/indecisiveness: no Suicidal ideations: no  Crying spells: no Recent Stressors/Life Changes: no   Relationship problems: no   Family stress: no     Financial stress: no    Job stress: no    Recent death/loss: no  Relevant past medical, surgical, family and social history reviewed and updated as indicated. Interim medical history since our last visit reviewed. Allergies and medications reviewed and updated.  Review of Systems  Constitutional: Negative.   Respiratory: Negative.    Cardiovascular: Negative.   Gastrointestinal:  Positive for abdominal pain, anal bleeding  and blood in stool. Negative for abdominal distention, constipation, diarrhea, nausea, rectal pain and vomiting.  Musculoskeletal: Negative.   Skin: Negative.   Neurological: Negative.   Psychiatric/Behavioral: Negative.      Per HPI unless specifically indicated above     Objective:    BP 123/77   Pulse 85   Temp 98 F (36.7 C)   Wt 254 lb 6.4 oz (115.4 kg)   SpO2 98%   BMI 35.48 kg/m   Wt Readings from Last 3 Encounters:  05/15/22 254 lb 6.4 oz (115.4 kg)  03/27/22 253 lb (114.8 kg)  03/06/22 262 lb (118.8 kg)    Physical Exam Vitals and nursing note reviewed.  Constitutional:      General: He is not in acute distress.    Appearance: Normal appearance. He is not ill-appearing, toxic-appearing or diaphoretic.  HENT:     Head: Normocephalic and atraumatic.     Right Ear: External ear normal.     Left Ear:  External ear normal.     Nose: Nose normal.     Mouth/Throat:     Mouth: Mucous membranes are moist.     Pharynx: Oropharynx is clear.  Eyes:     General: No scleral icterus.       Right eye: No discharge.        Left eye: No discharge.     Extraocular Movements: Extraocular movements intact.     Conjunctiva/sclera: Conjunctivae normal.     Pupils: Pupils are equal, round, and reactive to light.  Cardiovascular:     Rate and Rhythm: Normal rate and regular rhythm.     Pulses: Normal pulses.     Heart sounds: Normal heart sounds. No murmur heard.    No friction rub. No gallop.  Pulmonary:     Effort: Pulmonary effort is normal. No respiratory distress.     Breath sounds: Normal breath sounds. No stridor. No wheezing, rhonchi or rales.  Chest:     Chest wall: No tenderness.  Musculoskeletal:        General: Normal range of motion.     Cervical back: Normal range of motion and neck supple.  Skin:    General: Skin is warm and dry.     Capillary Refill: Capillary refill takes less than 2 seconds.     Coloration: Skin is not jaundiced or pale.     Findings: No bruising, erythema, lesion or rash.  Neurological:     General: No focal deficit present.     Mental Status: He is alert and oriented to person, place, and time. Mental status is at baseline.  Psychiatric:        Mood and Affect: Mood is anxious.        Behavior: Behavior normal.        Thought Content: Thought content normal.        Judgment: Judgment normal.     Results for orders placed or performed in visit on 42/70/62  Basic metabolic panel  Result Value Ref Range   Glucose 94 70 - 99 mg/dL   BUN 15 6 - 20 mg/dL   Creatinine, Ser 0.97 0.76 - 1.27 mg/dL   eGFR 108 >59 mL/min/1.73   BUN/Creatinine Ratio 15 9 - 20   Sodium 135 134 - 144 mmol/L   Potassium 4.1 3.5 - 5.2 mmol/L   Chloride 94 (L) 96 - 106 mmol/L   CO2 26 20 - 29 mmol/L   Calcium 9.6 8.7 - 10.2 mg/dL  Assessment & Plan:   Problem List Items  Addressed This Visit       Other   Anxiety    Will start low dose sertraline and recheck 1 month. Call with any concerns.       Relevant Medications   sertraline (ZOLOFT) 25 MG tablet   Other Visit Diagnoses     Rectal bleeding    -  Primary   Concern for hemorrhoids, but has been chronic and making him very anxious. Referral to GI placed today. Checking CBC, FOBT and starting anusol.    Relevant Orders   Fecal occult blood, imunochemical([facility])   Ambulatory referral to Gastroenterology   CBC with Differential/Platelet        Follow up plan: Return in about 4 weeks (around 06/12/2022) for OK to cancel appt in December.

## 2022-05-15 NOTE — Assessment & Plan Note (Signed)
Will start low dose sertraline and recheck 1 month. Call with any concerns.  °

## 2022-05-16 LAB — CBC WITH DIFFERENTIAL/PLATELET
Basophils Absolute: 0.1 10*3/uL (ref 0.0–0.2)
Basos: 1 %
EOS (ABSOLUTE): 0.4 10*3/uL (ref 0.0–0.4)
Eos: 5 %
Hematocrit: 44.4 % (ref 37.5–51.0)
Hemoglobin: 15.3 g/dL (ref 13.0–17.7)
Immature Grans (Abs): 0.1 10*3/uL (ref 0.0–0.1)
Immature Granulocytes: 1 %
Lymphocytes Absolute: 1.9 10*3/uL (ref 0.7–3.1)
Lymphs: 24 %
MCH: 28.8 pg (ref 26.6–33.0)
MCHC: 34.5 g/dL (ref 31.5–35.7)
MCV: 84 fL (ref 79–97)
Monocytes Absolute: 0.7 10*3/uL (ref 0.1–0.9)
Monocytes: 8 %
Neutrophils Absolute: 4.9 10*3/uL (ref 1.4–7.0)
Neutrophils: 61 %
Platelets: 351 10*3/uL (ref 150–450)
RBC: 5.31 x10E6/uL (ref 4.14–5.80)
RDW: 12.9 % (ref 11.6–15.4)
WBC: 8 10*3/uL (ref 3.4–10.8)

## 2022-05-21 ENCOUNTER — Encounter: Payer: Self-pay | Admitting: Family Medicine

## 2022-05-22 DIAGNOSIS — R109 Unspecified abdominal pain: Secondary | ICD-10-CM | POA: Diagnosis not present

## 2022-05-22 DIAGNOSIS — R1031 Right lower quadrant pain: Secondary | ICD-10-CM | POA: Diagnosis not present

## 2022-05-22 DIAGNOSIS — K625 Hemorrhage of anus and rectum: Secondary | ICD-10-CM | POA: Diagnosis not present

## 2022-05-22 DIAGNOSIS — M545 Low back pain, unspecified: Secondary | ICD-10-CM | POA: Diagnosis not present

## 2022-06-12 ENCOUNTER — Ambulatory Visit: Payer: BC Managed Care – PPO | Admitting: Family Medicine

## 2022-07-01 ENCOUNTER — Ambulatory Visit: Payer: BC Managed Care – PPO | Admitting: Family Medicine

## 2022-07-01 ENCOUNTER — Encounter: Payer: Self-pay | Admitting: Family Medicine

## 2022-07-01 VITALS — BP 131/82 | HR 87 | Temp 98.3°F | Ht 72.0 in | Wt 252.5 lb

## 2022-07-01 DIAGNOSIS — F419 Anxiety disorder, unspecified: Secondary | ICD-10-CM | POA: Diagnosis not present

## 2022-07-01 MED ORDER — SERTRALINE HCL 25 MG PO TABS
25.0000 mg | ORAL_TABLET | Freq: Every day | ORAL | 1 refills | Status: DC
Start: 2022-07-01 — End: 2022-10-17

## 2022-07-01 NOTE — Assessment & Plan Note (Signed)
Under good control on current regimen. Continue current regimen. Continue to monitor. Call with any concerns. Refills given.   

## 2022-07-01 NOTE — Progress Notes (Signed)
BP 131/82   Pulse 87   Temp 98.3 F (36.8 C)   Ht 6' (1.829 m)   Wt 252 lb 8 oz (114.5 kg)   SpO2 97%   BMI 34.25 kg/m    Subjective:    Patient ID: Daniel Martinez, male    DOB: Oct 27, 1990, 31 y.o.   MRN: [id number]  HPI: Daniel Martinez is a 31 y.o. male  Chief Complaint  Patient presents with   Anxiety    Patient states he is taking 25mg  of sertraline, medications seems to be helping    ANXIETY/STRESS Duration: chronic Status:better Anxious mood: no  Excessive worrying: no Irritability: no  Sweating: no Nausea: no Palpitations:no Hyperventilation: no Panic attacks: no Agoraphobia: no  Obscessions/compulsions: no Depressed mood: no    07/01/2022    9:10 AM 05/15/2022   10:23 AM 03/27/2022    9:07 AM 03/06/2022    2:32 PM 01/30/2022    9:13 AM  Depression screen PHQ 2/9  Decreased Interest 1 1 2 1 1   Down, Depressed, Hopeless 0 1 2 1 1   PHQ - 2 Score 1 2 4 2 2   Altered sleeping 1 2 2 2 1   Tired, decreased energy 2 2 2 2 1   Change in appetite 2 0 2 1 0  Feeling bad or failure about yourself  1 1 2 1  0  Trouble concentrating 0 1 2 1 1   Moving slowly or fidgety/restless 0 1 2 1  0  Suicidal thoughts 0 0 0 0 0  PHQ-9 Score 7 9 16 10 5   Difficult doing work/chores  Somewhat difficult Somewhat difficult     Anhedonia: no Weight changes: no Insomnia: no   Hypersomnia: yes Fatigue/loss of energy: yes Feelings of worthlessness: no Feelings of guilt: no Impaired concentration/indecisiveness: no Suicidal ideations: no  Crying spells: no Recent Stressors/Life Changes: no   Relationship problems: no   Family stress: no     Financial stress: no    Job stress: no    Recent death/loss: no  Relevant past medical, surgical, family and social history reviewed and updated as indicated. Interim medical history since our last visit reviewed. Allergies and medications reviewed and updated.  Review of Systems  Constitutional: Negative.   Respiratory: Negative.     Cardiovascular: Negative.   Musculoskeletal: Negative.   Neurological: Negative.   Psychiatric/Behavioral: Negative.      Per HPI unless specifically indicated above     Objective:    BP 131/82   Pulse 87   Temp 98.3 F (36.8 C)   Ht 6' (1.829 m)   Wt 252 lb 8 oz (114.5 kg)   SpO2 97%   BMI 34.25 kg/m   Wt Readings from Last 3 Encounters:  07/01/22 252 lb 8 oz (114.5 kg)  05/15/22 254 lb 6.4 oz (115.4 kg)  03/27/22 253 lb (114.8 kg)    Physical Exam Vitals and nursing note reviewed.  Constitutional:      General: He is not in acute distress.    Appearance: Normal appearance. He is obese. He is not ill-appearing, toxic-appearing or diaphoretic.  HENT:     Head: Normocephalic and atraumatic.     Right Ear: External ear normal.     Left Ear: External ear normal.     Nose: Nose normal.     Mouth/Throat:     Mouth: Mucous membranes are moist.     Pharynx: Oropharynx is clear.  Eyes:     General: No scleral icterus.  Right eye: No discharge.        Left eye: No discharge.     Extraocular Movements: Extraocular movements intact.     Conjunctiva/sclera: Conjunctivae normal.     Pupils: Pupils are equal, round, and reactive to light.  Cardiovascular:     Rate and Rhythm: Normal rate and regular rhythm.     Pulses: Normal pulses.     Heart sounds: Normal heart sounds. No murmur heard.    No friction rub. No gallop.  Pulmonary:     Effort: Pulmonary effort is normal. No respiratory distress.     Breath sounds: Normal breath sounds. No stridor. No wheezing, rhonchi or rales.  Chest:     Chest wall: No tenderness.  Musculoskeletal:        General: Normal range of motion.     Cervical back: Normal range of motion and neck supple.  Skin:    General: Skin is warm and dry.     Capillary Refill: Capillary refill takes less than 2 seconds.     Coloration: Skin is not jaundiced or pale.     Findings: No bruising, erythema, lesion or rash.  Neurological:     General:  No focal deficit present.     Mental Status: He is alert and oriented to person, place, and time. Mental status is at baseline.  Psychiatric:        Mood and Affect: Mood normal.        Behavior: Behavior normal.        Thought Content: Thought content normal.        Judgment: Judgment normal.     Results for orders placed or performed in visit on 05/15/22  CBC with Differential/Platelet  Result Value Ref Range   WBC 8.0 3.4 - 10.8 x10E3/uL   RBC 5.31 4.14 - 5.80 x10E6/uL   Hemoglobin 15.3 13.0 - 17.7 g/dL   Hematocrit 44.4 37.5 - 51.0 %   MCV 84 79 - 97 fL   MCH 28.8 26.6 - 33.0 pg   MCHC 34.5 31.5 - 35.7 g/dL   RDW 12.9 11.6 - 15.4 %   Platelets 351 150 - 450 x10E3/uL   Neutrophils 61 Not Estab. %   Lymphs 24 Not Estab. %   Monocytes 8 Not Estab. %   Eos 5 Not Estab. %   Basos 1 Not Estab. %   Neutrophils Absolute 4.9 1.4 - 7.0 x10E3/uL   Lymphocytes Absolute 1.9 0.7 - 3.1 x10E3/uL   Monocytes Absolute 0.7 0.1 - 0.9 x10E3/uL   EOS (ABSOLUTE) 0.4 0.0 - 0.4 x10E3/uL   Basophils Absolute 0.1 0.0 - 0.2 x10E3/uL   Immature Granulocytes 1 Not Estab. %   Immature Grans (Abs) 0.1 0.0 - 0.1 x10E3/uL      Assessment & Plan:   Problem List Items Addressed This Visit       Other   Anxiety - Primary    Under good control on current regimen. Continue current regimen. Continue to monitor. Call with any concerns. Refills given.        Relevant Medications   sertraline (ZOLOFT) 25 MG tablet     Follow up plan: Return in about 3 months (around 09/30/2022).

## 2022-08-04 ENCOUNTER — Ambulatory Visit: Payer: BC Managed Care – PPO | Admitting: Family Medicine

## 2022-09-04 ENCOUNTER — Ambulatory Visit: Payer: BC Managed Care – PPO | Admitting: Gastroenterology

## 2022-09-26 ENCOUNTER — Ambulatory Visit: Payer: BC Managed Care – PPO | Admitting: Family Medicine

## 2022-09-30 ENCOUNTER — Ambulatory Visit: Payer: BC Managed Care – PPO | Admitting: Family Medicine

## 2022-10-03 ENCOUNTER — Ambulatory Visit: Payer: BC Managed Care – PPO | Admitting: Family Medicine

## 2022-10-07 ENCOUNTER — Ambulatory Visit: Payer: BC Managed Care – PPO | Admitting: Family Medicine

## 2022-10-15 ENCOUNTER — Other Ambulatory Visit: Payer: Self-pay | Admitting: Family Medicine

## 2022-10-15 NOTE — Telephone Encounter (Signed)
Pt called, LVMTCB d/t pt is due for HTN fu with PCP. Advised to call back to schedule OV.

## 2022-10-15 NOTE — Telephone Encounter (Signed)
Requested medication (s) are due for refill today: yes  Requested medication (s) are on the active medication list: yes  Last refill:  03/27/22 #90/1  Future visit scheduled: no  Notes to clinic:  Unable to refill per protocol due to failed labs, no updated results.      Requested Prescriptions  Pending Prescriptions Disp Refills   lisinopril-hydrochlorothiazide (ZESTORETIC) 20-25 MG tablet [Pharmacy Med Name: LISINOPRIL-HCTZ 20/25MG  TABLETS] 90 tablet 1    Sig: Take 1 tablet by mouth daily.     Cardiovascular:  ACEI + Diuretic Combos Failed - 10/15/2022  3:42 AM      Failed - Na in normal range and within 180 days    Sodium  Date Value Ref Range Status  03/27/2022 135 134 - 144 mmol/L Final         Failed - K in normal range and within 180 days    Potassium  Date Value Ref Range Status  03/27/2022 4.1 3.5 - 5.2 mmol/L Final         Failed - Cr in normal range and within 180 days    Creatinine, Ser  Date Value Ref Range Status  03/27/2022 0.97 0.76 - 1.27 mg/dL Final         Failed - eGFR is 30 or above and within 180 days    GFR calc Af Amer  Date Value Ref Range Status  07/17/2020 118 >59 mL/min/1.73 Final    Comment:    **[facility] currently reports eGFR in compliance with the current**   recommendations of the [employer]. [facility] will   update reporting as new guidelines are published from the NK[employer]-ASN   Task force.    GFR calc non Af Amer  Date Value Ref Range Status  07/17/2020 102 >59 mL/min/1.73 Final   eGFR  Date Value Ref Range Status  03/27/2022 108 >59 mL/min/1.73 Final         Passed - Patient is not pregnant      Passed - Last BP in normal range    BP Readings from Last 1 Encounters:  07/01/22 131/82         Passed - Valid encounter within last 6 months    Recent Outpatient Visits           3 months ago [place], [place], DO   5 months ago Rectal bleeding   [place], Megan P, DO   6 months ago Essential hypertension   [place] P, DO   7 months ago Pain of toe of left foot   [place], Megan P, DO   8 months ago Routine general medical examination at a health care facility   [place], Barb Merino, DO       Future Appointments             In 2 days Wynetta Emery, Barb Merino, DO [facility]

## 2022-10-17 ENCOUNTER — Encounter: Payer: Self-pay | Admitting: Family Medicine

## 2022-10-17 ENCOUNTER — Ambulatory Visit: Payer: BC Managed Care – PPO | Admitting: Family Medicine

## 2022-10-17 VITALS — BP 127/74 | HR 87 | Temp 98.3°F | Ht 72.0 in | Wt 255.2 lb

## 2022-10-17 DIAGNOSIS — F419 Anxiety disorder, unspecified: Secondary | ICD-10-CM | POA: Diagnosis not present

## 2022-10-17 DIAGNOSIS — I10 Essential (primary) hypertension: Secondary | ICD-10-CM | POA: Diagnosis not present

## 2022-10-17 DIAGNOSIS — J0100 Acute maxillary sinusitis, unspecified: Secondary | ICD-10-CM

## 2022-10-17 MED ORDER — LISINOPRIL-HYDROCHLOROTHIAZIDE 20-25 MG PO TABS
1.0000 | ORAL_TABLET | Freq: Every day | ORAL | 1 refills | Status: DC
Start: 2022-10-17 — End: 2023-03-31

## 2022-10-17 MED ORDER — AMOXICILLIN-POT CLAVULANATE 875-125 MG PO TABS: 1.0000 | ORAL_TABLET | Freq: Two times a day (BID) | ORAL | 0 refills | Status: DC

## 2022-10-17 NOTE — Assessment & Plan Note (Signed)
Doing well off medicine. Continue to monitor. Call with any concerns.  

## 2022-10-17 NOTE — Progress Notes (Signed)
BP 127/74 (BP Location: Left Arm, Cuff Size: Normal)   Pulse 87   Temp 98.3 F (36.8 C) (Oral)   Ht 6' (1.829 m)   Wt 255 lb 3.2 oz (115.8 kg)   SpO2 99%   BMI 34.61 kg/m    Subjective:    Patient ID: Daniel Martinez, male    DOB: 1991-06-20, 32 y.o.   MRN: [id number]  HPI: Daniel Martinez is a 32 y.o. male  Chief Complaint  Patient presents with   Anxiety   Cough    Patient says he has a cough that has been lingering for a little over a week. Patient says he tested for COVID and it was negative. Patient says he is feeling better, but cough is just lingering.    UPPER RESPIRATORY TRACT INFECTION Duration: about a week Worst symptom: cough Fever: no Cough: yes Shortness of breath: no Wheezing: no Chest pain: no Chest tightness: no Chest congestion: yes Nasal congestion: yes Runny nose: yes Post nasal drip: no Sneezing: no Sore throat: yes Swollen glands: no Sinus pressure: no Headache: yes Face pain: no Toothache: no Ear pain: no  Ear pressure: no  Eyes red/itching:no Eye drainage/crusting: no  Vomiting: no Rash: no Fatigue: yes Sick contacts: yes Strep contacts: no  Context: stable Recurrent sinusitis: no Relief with OTC cold/cough medications: no  Treatments attempted: nyquil   ANXIETY/STRESS- stopped his sertraline due to temor a couple of months ago. Duration: months Status:better Anxious mood: yes  Excessive worrying: no Irritability: no  Sweating: no Nausea: no Palpitations:no Hyperventilation: no Panic attacks: no Agoraphobia: no  Obscessions/compulsions: no Depressed mood: no    10/17/2022    9:02 AM 07/01/2022    9:10 AM 05/15/2022   10:23 AM 03/27/2022    9:07 AM 03/06/2022    2:32 PM  Depression screen PHQ 2/9  Decreased Interest 1 1 1 2 1   Down, Depressed, Hopeless 1 0 1 2 1   PHQ - 2 Score 2 1 2 4 2   Altered sleeping 1 1 2 2 2   Tired, decreased energy 2 2 2 2 2   Change in appetite 0 2 0 2 1  Feeling bad or failure about  yourself  1 1 1 2 1   Trouble concentrating 1 0 1 2 1   Moving slowly or fidgety/restless 1 0 1 2 1   Suicidal thoughts 1 0 0 0 0  PHQ-9 Score 9 7 9 16 10   Difficult doing work/chores Not difficult at all  Somewhat difficult Somewhat difficult    Anhedonia: no Weight changes: no Insomnia: no   Hypersomnia: no Fatigue/loss of energy: no Feelings of worthlessness: no Feelings of guilt: no Impaired concentration/indecisiveness: no Suicidal ideations: no  Crying spells: no Recent Stressors/Life Changes: no   Relationship problems: no   Family stress: no     Financial stress: no    Job stress: no    Recent death/loss: no  HYPERTENSION  Hypertension status: controlled  Satisfied with current treatment? yes Duration of hypertension: chronic BP monitoring frequency:  not checking BP medication side effects:  no Medication compliance: excellent compliance Previous BP meds:lisinopril-HCTZ Aspirin: no Recurrent headaches: no Visual changes: no Palpitations: no Dyspnea: no Chest pain: no Lower extremity edema: no Dizzy/lightheaded: no  Relevant past medical, surgical, family and social history reviewed and updated as indicated. Interim medical history since our last visit reviewed. Allergies and medications reviewed and updated.  Review of Systems  Constitutional: Negative.   Respiratory: Negative.    Cardiovascular:  Negative.   Gastrointestinal: Negative.   Musculoskeletal: Negative.   Neurological: Negative.   Psychiatric/Behavioral: Negative.      Per HPI unless specifically indicated above     Objective:    BP 127/74 (BP Location: Left Arm, Cuff Size: Normal)   Pulse 87   Temp 98.3 F (36.8 C) (Oral)   Ht 6' (1.829 m)   Wt 255 lb 3.2 oz (115.8 kg)   SpO2 99%   BMI 34.61 kg/m   Wt Readings from Last 3 Encounters:  10/17/22 255 lb 3.2 oz (115.8 kg)  07/01/22 252 lb 8 oz (114.5 kg)  05/15/22 254 lb 6.4 oz (115.4 kg)    Physical Exam Vitals and nursing note  reviewed.  Constitutional:      General: He is not in acute distress.    Appearance: Normal appearance. He is not ill-appearing, toxic-appearing or diaphoretic.  HENT:     Head: Normocephalic and atraumatic.     Right Ear: External ear normal.     Left Ear: External ear normal.     Nose: Nose normal.     Mouth/Throat:     Mouth: Mucous membranes are moist.     Pharynx: Oropharynx is clear.  Eyes:     General: No scleral icterus.       Right eye: No discharge.        Left eye: No discharge.     Extraocular Movements: Extraocular movements intact.     Conjunctiva/sclera: Conjunctivae normal.     Pupils: Pupils are equal, round, and reactive to light.  Cardiovascular:     Rate and Rhythm: Normal rate and regular rhythm.     Pulses: Normal pulses.     Heart sounds: Normal heart sounds. No murmur heard.    No friction rub. No gallop.  Pulmonary:     Effort: Pulmonary effort is normal. No respiratory distress.     Breath sounds: Normal breath sounds. No stridor. No wheezing, rhonchi or rales.  Chest:     Chest wall: No tenderness.  Musculoskeletal:        General: Normal range of motion.     Cervical back: Normal range of motion and neck supple.  Skin:    General: Skin is warm and dry.     Capillary Refill: Capillary refill takes less than 2 seconds.     Coloration: Skin is not jaundiced or pale.     Findings: No bruising, erythema, lesion or rash.  Neurological:     General: No focal deficit present.     Mental Status: He is alert and oriented to person, place, and time. Mental status is at baseline.  Psychiatric:        Mood and Affect: Mood normal.        Behavior: Behavior normal.        Thought Content: Thought content normal.        Judgment: Judgment normal.     Results for orders placed or performed in visit on 05/15/22  CBC with Differential/Platelet  Result Value Ref Range   WBC 8.0 3.4 - 10.8 x10E3/uL   RBC 5.31 4.14 - 5.80 x10E6/uL   Hemoglobin 15.3 13.0 -  17.7 g/dL   Hematocrit 44.4 37.5 - 51.0 %   MCV 84 79 - 97 fL   MCH 28.8 26.6 - 33.0 pg   MCHC 34.5 31.5 - 35.7 g/dL   RDW 12.9 11.6 - 15.4 %   Platelets 351 150 - 450 x10E3/uL   Neutrophils 61  Not Estab. %   Lymphs 24 Not Estab. %   Monocytes 8 Not Estab. %   Eos 5 Not Estab. %   Basos 1 Not Estab. %   Neutrophils Absolute 4.9 1.4 - 7.0 x10E3/uL   Lymphocytes Absolute 1.9 0.7 - 3.1 x10E3/uL   Monocytes Absolute 0.7 0.1 - 0.9 x10E3/uL   EOS (ABSOLUTE) 0.4 0.0 - 0.4 x10E3/uL   Basophils Absolute 0.1 0.0 - 0.2 x10E3/uL   Immature Granulocytes 1 Not Estab. %   Immature Grans (Abs) 0.1 0.0 - 0.1 x10E3/uL      Assessment & Plan:   Problem List Items Addressed This Visit       Cardiovascular and Mediastinum   Essential hypertension    Under good control on current regimen. Continue current regimen. Continue to monitor. Call with any concerns. Refills given. Labs drawn today.       Relevant Medications   lisinopril-hydrochlorothiazide (ZESTORETIC) 20-25 MG tablet   Other Relevant Orders   Basic metabolic panel     Other   Anxiety    Doing well off medicine. Continue to monitor. Call with any concerns.       Other Visit Diagnoses     Acute non-recurrent maxillary sinusitis    -  Primary   Will treat with augmentin. Call with any concerns. Continue to monitor.   Relevant Medications   amoxicillin-clavulanate (AUGMENTIN) 875-125 MG tablet        Follow up plan: Return in about 6 months (around 04/17/2023) for physical.

## 2022-10-17 NOTE — Assessment & Plan Note (Signed)
Under good control on current regimen. Continue current regimen. Continue to monitor. Call with any concerns. Refills given. Labs drawn today.

## 2022-10-18 LAB — BASIC METABOLIC PANEL
BUN/Creatinine Ratio: 17 (ref 9–20)
BUN: 15 mg/dL (ref 6–20)
CO2: 24 mmol/L (ref 20–29)
Calcium: 9.3 mg/dL (ref 8.7–10.2)
Chloride: 97 mmol/L (ref 96–106)
Creatinine, Ser: 0.89 mg/dL (ref 0.76–1.27)
Glucose: 103 mg/dL — ABNORMAL HIGH (ref 70–99)
Potassium: 4 mmol/L (ref 3.5–5.2)
Sodium: 135 mmol/L (ref 134–144)
eGFR: 117 mL/min/{1.73_m2} (ref >59)

## 2022-10-22 ENCOUNTER — Encounter: Payer: Self-pay | Admitting: Family Medicine

## 2022-10-22 DIAGNOSIS — M79641 Pain in right hand: Secondary | ICD-10-CM

## 2022-11-07 DIAGNOSIS — M130 Polyarthritis, unspecified: Secondary | ICD-10-CM | POA: Diagnosis not present

## 2022-11-07 DIAGNOSIS — G5603 Carpal tunnel syndrome, bilateral upper limbs: Secondary | ICD-10-CM | POA: Diagnosis not present

## 2022-11-14 ENCOUNTER — Encounter: Payer: Self-pay | Admitting: Family Medicine

## 2022-12-31 ENCOUNTER — Encounter: Payer: Self-pay | Admitting: Gastroenterology

## 2022-12-31 ENCOUNTER — Ambulatory Visit: Payer: BC Managed Care – PPO | Admitting: Gastroenterology

## 2022-12-31 VITALS — BP 145/82 | HR 93 | Temp 98.0°F | Ht 72.0 in | Wt 260.2 lb

## 2022-12-31 DIAGNOSIS — K625 Hemorrhage of anus and rectum: Secondary | ICD-10-CM | POA: Diagnosis not present

## 2022-12-31 DIAGNOSIS — K5909 Other constipation: Secondary | ICD-10-CM

## 2022-12-31 NOTE — Progress Notes (Signed)
Cephas Darby, MD [address]  [place], [place] [address]  Main: [phone]  Fax: [phone]    Gastroenterology Consultation  Referring Provider:     Valerie Roys, DO Primary Care Physician:  Valerie Roys, DO Primary Gastroenterologist:  Dr. Cephas Darby Reason for Consultation:   Rectal bleeding        HPI:   Daniel Martinez is a 32 y.o. male referred by Dr. Wynetta Emery, Barb Merino, DO  for consultation & management of rectal bleeding.  Patient reports that he has been experiencing intermittent episodes of rectal bleeding, bright red blood per rectum that has been ongoing for several months.  He also reports chronic constipation and he knows that he is not eating right food.  He likes salty foods, spicy foods, eats red meat regularly.  His diet is devoid of fruits and vegetables.  He reports that his stools are not hard, they are generally soft or mushy but associated with straining and spends about 15 to 20 minutes on the toilet.  He does report rectal pressure and swelling.  The symptoms of bleeding and rectal pressure/swelling occur few times a month.  He has not tried any over-the-counter hemorrhoidal creams  No known history of anemia He denies any family history of GI malignancy  NSAIDs: None  Antiplts/Anticoagulants/Anti thrombotics: None  GI Procedures: None  Past Medical History:  Diagnosis Date   Hypertension     Past Surgical History:  Procedure Laterality Date   WISDOM TOOTH EXTRACTION Bilateral 2021     Current Outpatient Medications:    lisinopril-hydrochlorothiazide (ZESTORETIC) 20-25 MG tablet, Take 1 tablet by mouth daily., Disp: 90 tablet, Rfl: 1   Family History  Problem Relation Age of Onset   Hypertension Mother    Diabetes Mother    Hyperlipidemia Father      Social History   Tobacco Use   Smoking status: Never   Smokeless tobacco: Never  Vaping Use   Vaping Use: Never used  Substance Use Topics   Alcohol  use: Yes    Comment: rare   Drug use: No    Allergies as of 12/31/2022   (No Known Allergies)    Review of Systems:    All systems reviewed and negative except where noted in HPI.   Physical Exam:  BP (!) 145/82 (BP Location: Left Arm, Patient Position: Sitting, Cuff Size: Large)   Pulse 93   Temp 98 F (36.7 C) (Oral)   Ht 6' (1.829 m)   Wt 260 lb 4 oz (118 kg)   BMI 35.30 kg/m  No LMP for male patient.  General:   Alert,  Well-developed, well-nourished, pleasant and cooperative in NAD Head:  Normocephalic and atraumatic. Eyes:  Sclera clear, no icterus.   Conjunctiva pink. Ears:  Normal auditory acuity. Nose:  No deformity, discharge, or lesions. Mouth:  No deformity or lesions,oropharynx pink & moist. Neck:  Supple; no masses or thyromegaly. Lungs:  Respirations even and unlabored.  Clear throughout to auscultation.   No wheezes, crackles, or rhonchi. No acute distress. Heart:  Regular rate and rhythm; no murmurs, clicks, rubs, or gallops. Abdomen:  Normal bowel sounds. Soft, non-tender and non-distended without masses, hepatosplenomegaly or hernias noted.  No guarding or rebound tenderness.   Rectal: Not performed Msk:  Symmetrical without gross deformities. Good, equal movement & strength bilaterally. Pulses:  Normal pulses noted. Extremities:  No clubbing or edema.  No cyanosis. Neurologic:  Alert and oriented  x3;  grossly normal neurologically. Skin:  Intact without significant lesions or rashes. No jaundice. Psych:  Alert and cooperative. Normal mood and affect.  Imaging Studies: No abdominal imaging  Assessment and Plan:   Daniel Martinez is a 32 y.o. male with history of hypertension is seen in consultation for chronic constipation and intermittent episodes of bright red blood per rectum associated with rectal pressure/swelling  Chronic constipation Discussed about high-fiber diet, information provided Discussed about trial of MiraLAX and adequate intake  of water  Rectal bleeding Discussed about flexible sigmoidoscopy under anesthesia Patient is agreeable Discussed about outpatient hemorrhoid banding if sigmoidoscopy is unremarkable, information provided   Follow up based on the above workup   Cephas Darby, MD

## 2022-12-31 NOTE — Addendum Note (Signed)
Addended by: Lurlean Nanny on: 12/31/2022 03:14 PM   Modules accepted: Orders

## 2023-01-22 ENCOUNTER — Ambulatory Visit: Payer: BC Managed Care – PPO | Admitting: Family Medicine

## 2023-01-22 ENCOUNTER — Ambulatory Visit: Payer: Self-pay | Admitting: *Deleted

## 2023-01-22 NOTE — Telephone Encounter (Signed)
    Chief Complaint: right great toe injured and now purple and swollen per patient's dad on DPR Symptoms: right great toe swelling purple in color. Can walk can wear shoes.  Frequency: 2 days ago  Pertinent Negatives: Patient denies na Disposition: ED /[] Urgent Care (no appt availability in office) / Appointment(In office/virtual)/  [place] [facility]/ Home Care/ Refused Recommended Disposition /[] [place] [facility]/  Follow-up with PCP Additional Notes:   Appt scheduled for today      Reason for Disposition  Large swelling or bruise  Answer Assessment - Initial Assessment Questions 1. MECHANISM: "How did the injury happen?"      No description given by patient's father on DPR 2. ONSET: "When did the injury happen?" (Minutes or hours ago)      2 days ago  3. LOCATION: "What part of the toe is injured?" "Is the nail damaged?"      Right great toe 4. APPEARANCE of TOE INJURY: "What does the injury look like?"      Purple in color and swollen  5. SEVERITY: "Can you use the foot normally?" "Can you walk?"      Can walk still working  6. SIZE: For cuts, bruises, or swelling, ask: "How large is it?" (e.g., inches or centimeters;  entire toe)      Swelling  7. PAIN: "Is there pain?" If Yes, ask: "How bad is the pain?"   (e.g., Scale 1-10; or mild, moderate, severe)     Denies  8. TETANUS: For any breaks in the skin, ask: "When was the last tetanus booster?"     na 9. DIABETES: "Do you have a history of diabetes or poor circulation in the feet?"     na 10. OTHER SYMPTOMS: "Do you have any other symptoms?"        na 11. PREGNANCY: "Is there any chance you are pregnant?" "When was your last menstrual period?"       na  Protocols used: Toe Injury-A-AH

## 2023-01-23 ENCOUNTER — Ambulatory Visit: Payer: BC Managed Care – PPO | Admitting: Nurse Practitioner

## 2023-01-23 ENCOUNTER — Encounter: Payer: Self-pay | Admitting: Nurse Practitioner

## 2023-01-23 VITALS — BP 129/81 | HR 74 | Temp 97.8°F | Ht 72.01 in | Wt 254.4 lb

## 2023-01-23 DIAGNOSIS — M79674 Pain in right toe(s): Secondary | ICD-10-CM | POA: Insufficient documentation

## 2023-01-23 NOTE — Assessment & Plan Note (Signed)
Acute starting 2 days ago after self inflicted trauma.  Will get imaging to further assess, suspect fracture present to toe.  Recommend use of Ibuprofen as needed + Tylenol, alternate.  For discomfort and possible fracture recommend buddy taping toes and showed picture to him on how to perform this.  Discussed toe fractures and healing time.

## 2023-01-23 NOTE — Patient Instructions (Signed)
Toe Fracture A toe fracture is a break in one of the toe bones (phalanges). This may happen if you: Drop a heavy object on your toe. Stub your toe. Twist your toe. Exercise the same way too much. What are the signs or symptoms? The main symptoms are swelling and pain in the toe. You may also have: Bruising. Stiffness. Numbness. A change in the way the toe looks. Broken bones that poke through the skin. Blood under the toenail. How is this treated? Treatments may include: Taping the broken toe to a toe that is next to it (buddy taping). Wearing a shoe that has a wide, rigid sole to protect the toe and to limit its movement. Wearing a cast. Surgery. This may be needed if the: Pieces of broken bone are out of place. Bone pokes through the skin. Physical therapy. Follow these instructions at home: If you have a shoe: Wear the shoe as told by your doctor. Remove it only as told by your doctor. Loosen the shoe if your toes tingle, become numb, or turn cold and blue. Keep the shoe clean and dry. If you have a cast: Do not put pressure on any part of the cast until it is fully hardened. This may take a few hours. Do not stick anything inside the cast to scratch your skin. Check the skin around the cast every day. Tell your doctor about any concerns. You may put lotion on dry skin around the edges of the cast. Do not put lotion on the skin under the cast. Keep the cast clean and dry. Bathing Do not take baths, swim, or use a hot tub until your doctor says it is okay. Ask your doctor if you can take showers. If the shoe or cast is not waterproof: Do not let it get wet. Cover it with a watertight covering when you take a bath or a shower. Activity Do not use your foot to support your body weight until your doctor says it is okay. Use crutches as told by your doctor. Ask your doctor what activities are safe for you during recovery. Avoid activities as told by your doctor. Do  exercises as told by your doctor or therapist. Driving Do not drive or use heavy machinery while taking pain medicine. Do not drive while wearing a cast on a foot that you use for driving. Managing pain, stiffness, and swelling  Put ice on the injured area if told by your doctor: Put ice in a plastic bag. Place a towel between your skin and the bag. If you have a shoe, remove it as told by your doctor. If you have a cast, place a towel between your cast and the bag. Leave the ice on for 20 minutes, 2-3 times per day. Raise (elevate) the injured area above the level of your heart while you are sitting or lying down. General instructions If your toe was taped to a toe that is next to it, follow your doctor's instructions for changing the gauze and tape. Change it more often: If the gauze and tape get wet. If this happens, dry the space between the toes. If the gauze and tape are too tight and they cause your toe to become pale or to lose feeling (go numb). If your doctor did not give you a protective shoe, wear sturdy shoes that support your foot. Your shoes should not: Pinch your toes. Fit tightly against your toes. Do not use any tobacco products, including cigarettes, chewing tobacco, or e-cigarettes.   These can delay bone healing. If you need help quitting, ask your doctor. Take medicines only as told by your doctor. Keep all follow-up visits as told by your doctor. This is important. Contact a doctor if: Your pain medicine is not helping. You have a fever. You notice a bad smell coming from your cast. Get help right away if: You lose feeling (have numbness) in your toe or foot, and it is getting worse. Your toe or your foot tingles. Your toe or your foot gets cold or turns blue. You have redness or swelling in your toe or foot, and it is getting worse. You have very bad pain. Summary A toe fracture is a break in one of the toe bones. Use ice and raise your foot. This will help  lessen pain and swelling. Use crutches as told by your doctor. This information is not intended to replace advice given to you by your health care provider. Make sure you discuss any questions you have with your health care provider. Document Revised: 02/25/2021 Document Reviewed: 02/25/2021 Elsevier Patient Education  2023 Elsevier Inc[employer]  

## 2023-01-23 NOTE — Progress Notes (Signed)
BP 129/81   Pulse 74   Temp 97.8 F (36.6 C) (Oral)   Ht 6' 0.01" (1.829 m)   Wt 254 lb 6.4 oz (115.4 kg)   SpO2 98%   BMI 34.50 kg/m    Subjective:    Patient ID: Daniel Martinez, male    DOB: 11/19/1990, 32 y.o.   MRN: [id number]  HPI: Daniel Martinez is a 32 y.o. male  Chief Complaint  Patient presents with   Toe Pain    Right great toe swollen and purple, started 2 days ago, went to kick something and hit something   FOOT PAIN Right great toe pain, went to go kick a burn barrel, "being stupid", 2 days ago and started with bruising and swelling + pain after this.  He is concerned for broken great toe. Duration: days Involved foot: right Mechanism of injury: trauma Location: right great toe Onset: sudden  Severity: 5/10  Quality:  dull, aching, and throbbing, sticking kind of pain Frequency: intermittent Radiation: no Aggravating factors: weight bearing, walking, bending, and movement  Alleviating factors: NSAIDs and rest  Status: stable Treatments attempted: rest and ibuprofen , ice Relief with NSAIDs?:  moderate Weakness with weight bearing or walking: no Morning stiffness: no Swelling: yes Redness: no Bruising: yes Paresthesias / decreased sensation: no  Fevers:no   Relevant past medical, surgical, family and social history reviewed and updated as indicated. Interim medical history since our last visit reviewed. Allergies and medications reviewed and updated.  Review of Systems  Constitutional:  Negative for activity change, diaphoresis, fatigue and fever.  Respiratory:  Negative for cough, chest tightness, shortness of breath and wheezing.   Cardiovascular:  Negative for chest pain, palpitations and leg swelling.  Gastrointestinal: Negative.   Neurological: Negative.   Psychiatric/Behavioral: Negative.     Per HPI unless specifically indicated above     Objective:    BP 129/81   Pulse 74   Temp 97.8 F (36.6 C) (Oral)   Ht 6' 0.01" (1.829  m)   Wt 254 lb 6.4 oz (115.4 kg)   SpO2 98%   BMI 34.50 kg/m   Wt Readings from Last 3 Encounters:  01/23/23 254 lb 6.4 oz (115.4 kg)  12/31/22 260 lb 4 oz (118 kg)  10/17/22 255 lb 3.2 oz (115.8 kg)    Physical Exam Vitals and nursing note reviewed.  Constitutional:      General: He is awake. He is not in acute distress.    Appearance: He is well-developed and well-groomed. He is obese. He is not ill-appearing or toxic-appearing.  HENT:     Head: Normocephalic.     Right Ear: Hearing and external ear normal.     Left Ear: Hearing and external ear normal.  Eyes:     General: Lids are normal.     Extraocular Movements: Extraocular movements intact.     Conjunctiva/sclera: Conjunctivae normal.  Neck:     Thyroid: No thyromegaly.     Vascular: No carotid bruit.  Cardiovascular:     Rate and Rhythm: Normal rate and regular rhythm.     Pulses:          Dorsalis pedis pulses are 2+ on the right side and 2+ on the left side.       Posterior tibial pulses are 2+ on the right side and 2+ on the left side.     Heart sounds: Normal heart sounds.  Pulmonary:     Effort: No accessory muscle usage  or respiratory distress.     Breath sounds: Normal breath sounds.  Abdominal:     General: Bowel sounds are normal. There is no distension.     Palpations: Abdomen is soft.     Tenderness: There is no abdominal tenderness.  Musculoskeletal:     Cervical back: Full passive range of motion without pain.     Right lower leg: No edema.     Left lower leg: No edema.     Right foot: Decreased range of motion (right great toe). Deformity (right great toe with swelling and bruising) present.  Feet:     Right foot:     Protective Sensation: 10 sites tested.  10 sites sensed.     Skin integrity: Skin integrity normal.     Toenail Condition: Right toenails are normal.     Left foot:     Protective Sensation: 10 sites tested.  10 sites sensed.     Skin integrity: Skin integrity normal.     Toenail  Condition: Left toenails are normal.     Comments: Right great toe with mild swelling and bruising (purple/yellow).  Tenderness to touch and some decreased ROM present.   Lymphadenopathy:     Cervical: No cervical adenopathy.  Skin:    General: Skin is warm.     Capillary Refill: Capillary refill takes less than 2 seconds.  Neurological:     Mental Status: He is alert and oriented to person, place, and time.     Deep Tendon Reflexes: Reflexes are normal and symmetric.     Reflex Scores:      Brachioradialis reflexes are 2+ on the right side and 2+ on the left side.      Patellar reflexes are 2+ on the right side and 2+ on the left side. Psychiatric:        Attention and Perception: Attention normal.        Mood and Affect: Mood normal.        Speech: Speech normal.        Behavior: Behavior normal. Behavior is cooperative.        Thought Content: Thought content normal.     Results for orders placed or performed in visit on 10/17/22  Basic metabolic panel  Result Value Ref Range   Glucose 103 (H) 70 - 99 mg/dL   BUN 15 6 - 20 mg/dL   Creatinine, Ser 1.61 0.76 - 1.27 mg/dL   eGFR 096 >04 VW/UJW/1.19   BUN/Creatinine Ratio 17 9 - 20   Sodium 135 134 - 144 mmol/L   Potassium 4.0 3.5 - 5.2 mmol/L   Chloride 97 96 - 106 mmol/L   CO2 24 20 - 29 mmol/L   Calcium 9.3 8.7 - 10.2 mg/dL      Assessment & Plan:   Problem List Items Addressed This Visit       Other   Great toe pain, right - Primary    Acute starting 2 days ago after self inflicted trauma.  Will get imaging to further assess, suspect fracture present to toe.  Recommend use of Ibuprofen as needed + Tylenol, alternate.  For discomfort and possible fracture recommend buddy taping toes and showed picture to him on how to perform this.  Discussed toe fractures and healing time.      Relevant Orders   DG Foot Complete Right     Follow up plan: Return if symptoms worsen or fail to improve.

## 2023-01-26 ENCOUNTER — Encounter: Payer: Self-pay | Admitting: General Practice

## 2023-01-26 ENCOUNTER — Encounter
Admission: RE | Disposition: A | Discharge status: Home / Self Care | Payer: Self-pay | Source: Home / Self Care | Attending: Gastroenterology

## 2023-01-26 ENCOUNTER — Ambulatory Visit
Admission: RE | Admit: 2023-01-26 | Discharge: 2023-01-26 | Disposition: A | Discharge status: Home / Self Care | Payer: BC Managed Care – PPO | Attending: Gastroenterology | Admitting: Gastroenterology

## 2023-01-26 ENCOUNTER — Encounter: Payer: Self-pay | Admitting: Gastroenterology

## 2023-01-26 DIAGNOSIS — K644 Residual hemorrhoidal skin tags: Secondary | ICD-10-CM | POA: Insufficient documentation

## 2023-01-26 DIAGNOSIS — K621 Rectal polyp: Secondary | ICD-10-CM | POA: Diagnosis not present

## 2023-01-26 DIAGNOSIS — K625 Hemorrhage of anus and rectum: Secondary | ICD-10-CM | POA: Insufficient documentation

## 2023-01-26 DIAGNOSIS — K648 Other hemorrhoids: Secondary | ICD-10-CM | POA: Insufficient documentation

## 2023-01-26 DIAGNOSIS — D128 Benign neoplasm of rectum: Secondary | ICD-10-CM | POA: Diagnosis not present

## 2023-01-26 HISTORY — PX: FLEXIBLE SIGMOIDOSCOPY: SHX5431

## 2023-01-26 SURGERY — SIGMOIDOSCOPY, FLEXIBLE
Anesthesia: General

## 2023-01-26 MED ORDER — SODIUM CHLORIDE 0.9 % IV SOLN
INTRAVENOUS | Status: DC
  Administered 2023-01-26: 1000 mL via INTRAVENOUS

## 2023-01-26 NOTE — Op Note (Signed)
[facility] Gastroenterology Patient Name: Daniel Martinez Procedure Date: 01/26/2023 11:34 AM MRN: [id number] Account #: [id number] Date of Birth: 1991-03-04 Admit Type: Outpatient Age: 32 Room: [facility] ENDO ROOM 4 Gender: Male Note Status: Finalized Instrument Name: Upper Endoscope [id number] Procedure:             Flexible Sigmoidoscopy Indications:           Rectal hemorrhage Providers:             Toney Reil MD, MD Referring MD:          Dorcas Carrow (Referring MD) Medicines:             None Complications:         No immediate complications. Estimated blood loss:                         Minimal. Procedure:             Pre-Anesthesia Assessment:                        - Prior to the procedure, a History and Physical was                         performed, and patient medications and allergies were                         reviewed. The patient is competent. The risks and                         benefits of the procedure and the sedation options and                         risks were discussed with the patient. All questions                         were answered and informed consent was obtained.                         Patient identification and proposed procedure were                         verified by the physician, the nurse and the                         technician in the pre-procedure area in the procedure                         room in the endoscopy suite. Mental Status                         Examination: alert and oriented. Airway Examination:                         normal oropharyngeal airway and neck mobility.                         Respiratory Examination: clear to auscultation. CV  Examination: normal. Prophylactic Antibiotics: The                         patient does not require prophylactic antibiotics.                         Prior Anticoagulants: The patient has taken no                         anticoagulant or  antiplatelet agents. ASA Grade                         Assessment: I - A normal, healthy patient. After                         reviewing the risks and benefits, the patient was                         deemed in satisfactory condition to undergo the                         procedure. The anesthesia plan was to use no sedation                         or anesthesia. Immediately prior to administration of                         medications, the patient was re-assessed for adequacy                         to receive sedatives. The heart rate, respiratory                         rate, oxygen saturations, blood pressure, adequacy of                         pulmonary ventilation, and response to care were                         monitored throughout the procedure. The physical                         status of the patient was re-assessed after the                         procedure.                        After obtaining informed consent, the scope was passed                         under direct vision. The Endosonoscope was introduced                         through the anus and advanced to the the left                         transverse colon. The flexible sigmoidoscopy was  accomplished without difficulty. The patient tolerated                         the procedure well. The quality of the bowel                         preparation was good. Findings:      The perianal and digital rectal examinations were normal. Pertinent       negatives include normal sphincter tone and no palpable rectal lesions.      A localized area of polypoid mucosa was found in the distal rectum.       Biopsies were taken with a cold forceps for histology.      Normal mucosa was found in the recto-sigmoid colon, in the sigmoid       colon, in the descending colon, at the splenic flexure and in the mid       transverse colon.      Non-bleeding external and internal hemorrhoids were found during        retroflexion. The hemorrhoids were medium-sized. Impression:            - Polypoid mucosa in the distal rectum. Biopsied.                        - Normal mucosa in the recto-sigmoid colon, in the                         sigmoid colon, in the descending colon, at the splenic                         flexure and in the mid transverse colon.                        - Non-bleeding external and internal hemorrhoids. Recommendation:        - Discharge patient to home (ambulatory).                        - Resume previous diet today.                        - Await pathology results.                        - Return to my office PRN. Procedure Code(s):     --- Professional ---                        [id number], Sigmoidoscopy, flexible; with biopsy, single or                         multiple Diagnosis Code(s):     --- Professional ---                        K62.89, Other specified diseases of anus and rectum                        K64.8, Other hemorrhoids                        K62.5, Hemorrhage of anus and rectum CPT copyright  2022 American Medical Association. All rights reserved. The codes documented in this report are preliminary and upon coder review may  be revised to meet current compliance requirements. Dr. Libby Maw Toney Reil MD, MD 01/26/2023 12:04:41 PM This report has been signed electronically. Number of Addenda: 0 Note Initiated On: 01/26/2023 11:34 AM Total Procedure Duration: 0 hours 10 minutes 21 seconds  Estimated Blood Loss:  Estimated blood loss was minimal.      [facility]

## 2023-01-26 NOTE — H&P (Signed)
  Daniel Repress, MD [address]  [address]  [place], [place] [address]  Main: [phone]  Fax: [phone] Pager: [phone]  Primary Care Physician:  Dorcas Carrow, DO Primary Gastroenterologist:  Dr. Arlyss Martinez  Pre-Procedure History & Physical: HPI:  Daniel Martinez is a 32 y.o. male is here for an flexible sigmoidoscopy.   Past Medical History:  Diagnosis Date   Hypertension     Past Surgical History:  Procedure Laterality Date   WISDOM TOOTH EXTRACTION Bilateral 2021    Prior to Admission medications   Medication Sig Start Date End Date Taking? Authorizing Provider  lisinopril-hydrochlorothiazide (ZESTORETIC) 20-25 MG tablet Take 1 tablet by mouth daily. 10/17/22   Dorcas Carrow, DO    Allergies as of 01/01/2023   (No Known Allergies)    Family History  Problem Relation Age of Onset   Hypertension Mother    Diabetes Mother    Hyperlipidemia Father     Social History   Socioeconomic History   Marital status: Single    Spouse name: Not on file   Number of children: Not on file   Years of education: Not on file   Highest education level: Not on file  Occupational History   Not on file  Tobacco Use   Smoking status: Never   Smokeless tobacco: Never  Vaping Use   Vaping Use: Never used  Substance and Sexual Activity   Alcohol use: Yes    Comment: rare   Drug use: No   Sexual activity: Not Currently  Other Topics Concern   Not on file  Social History Narrative   Not on file   Social Determinants of Health   Financial Resource Strain: Not on file  Food Insecurity: Not on file  Transportation Needs: Not on file  Physical Activity: Not on file  Stress: Not on file  Social Connections: Not on file  Intimate Partner Violence: Not on file    Review of Systems: See HPI, otherwise negative ROS  Physical Exam: BP (!) 155/88   Pulse 95   Temp (!) 96.7 F (35.9 C) (Temporal)   Resp 18   Ht  (1.803 m)   Wt 114.3 kg    SpO2 100%   BMI 35.15 kg/m  General:   Alert,  pleasant and cooperative in NAD Head:  Normocephalic and atraumatic. Neck:  Supple; no masses or thyromegaly. Lungs:  Clear throughout to auscultation.    Heart:  Regular rate and rhythm. Abdomen:  Soft, nontender and nondistended. Normal bowel sounds, without guarding, and without rebound.   Neurologic:  Alert and  oriented x4;  grossly normal neurologically.  Impression/Plan: Daniel Martinez is here for an flexible sigmoidoscopy to be performed for rectal bleeding  Risks, benefits, limitations, and alternatives regarding  flexible sigmoidoscopy have been reviewed with the patient.  Questions have been answered.  All parties agreeable.   Lannette Donath, MD  01/26/2023, 11:01 AM

## 2023-01-26 NOTE — Anesthesia Preprocedure Evaluation (Signed)
Anesthesia Evaluation  Patient identified by MRN, date of birth, ID band Patient awake    Reviewed: Allergy & Precautions, NPO status , Patient's Chart, lab work & pertinent test results  Airway Mallampati: III  TM Distance: >3 FB Neck ROM: full    Dental  (+) Chipped   Pulmonary neg pulmonary ROS   Pulmonary exam normal        Cardiovascular hypertension, negative cardio ROS Normal cardiovascular exam     Neuro/Psych negative neurological ROS  negative psych ROS   GI/Hepatic negative GI ROS, Neg liver ROS,,,  Endo/Other  negative endocrine ROS    Renal/GU negative Renal ROS  negative genitourinary   Musculoskeletal   Abdominal   Peds  Hematology negative hematology ROS (+)   Anesthesia Other Findings Patient drank a whole water bottle 30 minutes ago. Told the patient that his procedure would need to wait another 1.5 hours.   Past Medical History: No date: Hypertension  Past Surgical History: 2021: WISDOM TOOTH EXTRACTION; Bilateral  BMI    Body Mass Index: 35.15 kg/m      Reproductive/Obstetrics negative OB ROS                             Anesthesia Physical Anesthesia Plan  ASA: 2  Anesthesia Plan: General   Post-op Pain Management: Minimal or no pain anticipated   Induction: Intravenous  PONV Risk Score and Plan: 3 and Propofol infusion, TIVA and Ondansetron  Airway Management Planned: Nasal Cannula  Additional Equipment: None  Intra-op Plan:   Post-operative Plan:   Informed Consent: I have reviewed the patients History and Physical, chart, labs and discussed the procedure including the risks, benefits and alternatives for the proposed anesthesia with the patient or authorized representative who has indicated his/her understanding and acceptance.     Dental advisory given  Plan Discussed with: CRNA and Surgeon  Anesthesia Plan Comments: (Discussed risks of  anesthesia with patient, including possibility of difficulty with spontaneous ventilation under anesthesia necessitating airway intervention, PONV, and rare risks such as cardiac or respiratory or neurological events, and allergic reactions. Discussed the role of CRNA in patient's perioperative care. Patient understands.)       Anesthesia Quick Evaluation

## 2023-01-27 ENCOUNTER — Encounter: Payer: Self-pay | Admitting: Gastroenterology

## 2023-01-27 LAB — SURGICAL PATHOLOGY

## 2023-01-28 ENCOUNTER — Encounter: Payer: Self-pay | Admitting: Gastroenterology

## 2023-02-25 ENCOUNTER — Other Ambulatory Visit: Payer: Self-pay | Admitting: Family Medicine

## 2023-02-25 NOTE — Telephone Encounter (Signed)
Requested Prescriptions  Pending Prescriptions Disp Refills   lisinopril-hydrochlorothiazide (ZESTORETIC) 20-25 MG tablet [Pharmacy Med Name: LISINOPRIL-HCTZ 20/25MG  TABLETS] 90 tablet 1    Sig: TAKE 1 TABLET BY MOUTH DAILY     Cardiovascular:  ACEI + Diuretic Combos Passed - 02/25/2023  8:04 AM      Passed - Na in normal range and within 180 days    Sodium  Date Value Ref Range Status  10/17/2022 135 134 - 144 mmol/L Final         Passed - K in normal range and within 180 days    Potassium  Date Value Ref Range Status  10/17/2022 4.0 3.5 - 5.2 mmol/L Final         Passed - Cr in normal range and within 180 days    Creatinine, Ser  Date Value Ref Range Status  10/17/2022 0.89 0.76 - 1.27 mg/dL Final         Passed - eGFR is 30 or above and within 180 days    GFR calc Af Amer  Date Value Ref Range Status  07/17/2020 118 >59 mL/min/1.73 Final    Comment:    **[facility] currently reports eGFR in compliance with the current**   recommendations of the [employer]. [facility] will   update reporting as new guidelines are published from the NKF-ASN   Task force.    GFR calc non Af Amer  Date Value Ref Range Status  07/17/2020 102 >59 mL/min/1.73 Final   eGFR  Date Value Ref Range Status  10/17/2022 117 >59 mL/min/1.73 Final         Passed - Patient is not pregnant      Passed - Last BP in normal range    BP Readings from Last 1 Encounters:  01/26/23 127/82         Passed - Valid encounter within last 6 months    Recent Outpatient Visits           1 month ago Great toe pain, right   [facility] [place], [place], NP   4 months ago Acute non-recurrent maxillary sinusitis   [place] [facility] [place], [place], DO   7 months ago Anxiety   [facility] [place], [place], DO   9 months ago Rectal bleeding   [facility] [place], [place], DO   11 months ago Essential  hypertension   [place] [facility] [place], Oralia Rud, DO       Future Appointments             In 1 month Johnson, Oralia Rud, DO [facility]

## 2023-03-31 ENCOUNTER — Encounter: Payer: Self-pay | Admitting: Family Medicine

## 2023-03-31 ENCOUNTER — Ambulatory Visit (INDEPENDENT_AMBULATORY_CARE_PROVIDER_SITE_OTHER): Payer: BC Managed Care – PPO | Admitting: Family Medicine

## 2023-03-31 VITALS — BP 123/79 | HR 73 | Temp 97.9°F | Ht 72.0 in | Wt 254.6 lb

## 2023-03-31 DIAGNOSIS — K219 Gastro-esophageal reflux disease without esophagitis: Secondary | ICD-10-CM | POA: Diagnosis not present

## 2023-03-31 DIAGNOSIS — M79641 Pain in right hand: Secondary | ICD-10-CM

## 2023-03-31 DIAGNOSIS — M79642 Pain in left hand: Secondary | ICD-10-CM

## 2023-03-31 DIAGNOSIS — I10 Essential (primary) hypertension: Secondary | ICD-10-CM

## 2023-03-31 DIAGNOSIS — Z0000 Encounter for general adult medical examination without abnormal findings: Secondary | ICD-10-CM | POA: Diagnosis not present

## 2023-03-31 LAB — MICROALBUMIN, URINE WAIVED
Creatinine, Urine Waived: 300 mg/dL (ref 10–300)
Microalb, Ur Waived: 30 mg/L — ABNORMAL HIGH (ref 0–19)
Microalb/Creat Ratio: <30 mg/g (ref <30)

## 2023-03-31 LAB — URINALYSIS, ROUTINE W REFLEX MICROSCOPIC
Bilirubin, UA: NEGATIVE
Glucose, UA: NEGATIVE
Ketones, UA: NEGATIVE
Leukocytes,UA: NEGATIVE
Nitrite, UA: NEGATIVE
RBC, UA: NEGATIVE
Specific Gravity, UA: >1.03 — ABNORMAL HIGH (ref 1.005–1.030)
Urobilinogen, Ur: 0.2 mg/dL (ref 0.2–1.0)
pH, UA: 6 (ref 5.0–7.5)

## 2023-03-31 MED ORDER — OMEPRAZOLE 20 MG PO CPDR
20.0000 mg | DELAYED_RELEASE_CAPSULE | Freq: Every day | ORAL | 1 refills | Status: DC
Start: 2023-03-31 — End: 2023-08-17

## 2023-03-31 MED ORDER — LISINOPRIL-HYDROCHLOROTHIAZIDE 20-25 MG PO TABS: 1.0000 | ORAL_TABLET | Freq: Every day | ORAL | 1 refills | Status: DC

## 2023-03-31 NOTE — Progress Notes (Signed)
BP 123/79   Pulse 73   Temp 97.9 F (36.6 C)   Ht 6' (1.829 m)   Wt 254 lb 9.6 oz (115.5 kg)   SpO2 98%   BMI 34.53 kg/m    Subjective:    Patient ID: Daniel Martinez, male    DOB: 09-Dec-1990, 32 y.o.   MRN: [id number]  HPI: Daniel Martinez is a 32 y.o. male presenting on 03/31/2023 for comprehensive medical examination. Current medical complaints include:  HYPERTENSION  Hypertension status: controlled  Satisfied with current treatment? yes Duration of hypertension: chronic BP monitoring frequency:  not checking BP medication side effects:  no Medication compliance: excellent compliance Previous BP meds:lisinopril-HCTZ Aspirin: no Recurrent headaches: no Visual changes: no Palpitations: no Dyspnea: no Chest pain: no Lower extremity edema: no Dizzy/lightheaded: no   He currently lives with: parents, sister and her kids Interim Problems from his last visit: no  Depression Screen done today and results listed below:     03/31/2023   10:07 AM 01/23/2023    8:18 AM 10/17/2022    9:02 AM 07/01/2022    9:10 AM 05/15/2022   10:23 AM  Depression screen PHQ 2/9  Decreased Interest 1 1 1 1 1   Down, Depressed, Hopeless 1 1 1  0 1  PHQ - 2 Score 2 2 2 1 2   Altered sleeping 1 1 1 1 2   Tired, decreased energy 1 1 2 2 2   Change in appetite 1 1 0 2 0  Feeling bad or failure about yourself  1 1 1 1 1   Trouble concentrating 1 1 1  0 1  Moving slowly or fidgety/restless 1 1 1  0 1  Suicidal thoughts 0 0 1 0 0  PHQ-9 Score 8 8 9 7 9   Difficult doing work/chores   Not difficult at all  Somewhat difficult    Past Medical History:  Past Medical History:  Diagnosis Date   Hypertension     Surgical History:  Past Surgical History:  Procedure Laterality Date   FLEXIBLE SIGMOIDOSCOPY N/A 01/26/2023   Procedure: FLEXIBLE SIGMOIDOSCOPY;  Surgeon: Toney Reil, MD;  Location: [facility] ENDOSCOPY;  Service: Gastroenterology;  Laterality: N/A;   WISDOM TOOTH EXTRACTION Bilateral  2021    Medications:  No current outpatient medications on file prior to visit.   No current facility-administered medications on file prior to visit.    Allergies:  No Known Allergies  Social History:  Social History   Socioeconomic History   Marital status: Single    Spouse name: Not on file   Number of children: Not on file   Years of education: Not on file   Highest education level: Not on file  Occupational History   Not on file  Tobacco Use   Smoking status: Never   Smokeless tobacco: Never  Vaping Use   Vaping Use: Never used  Substance and Sexual Activity   Alcohol use: Yes    Comment: rare   Drug use: No   Sexual activity: Not Currently  Other Topics Concern   Not on file  Social History Narrative   Not on file   Social Determinants of Health   Financial Resource Strain: Not on file  Food Insecurity: Not on file  Transportation Needs: Not on file  Physical Activity: Not on file  Stress: Not on file  Social Connections: Not on file  Intimate Partner Violence: Not on file   Social History   Tobacco Use  Smoking Status Never  Smokeless Tobacco  Never   Social History   Substance and Sexual Activity  Alcohol Use Yes   Comment: rare    Family History:  Family History  Problem Relation Age of Onset   Hypertension Mother    Diabetes Mother    Hyperlipidemia Father     Past medical history, surgical history, medications, allergies, family history and social history reviewed with patient today and changes made to appropriate areas of the chart.   Review of Systems  Constitutional: Negative.   HENT:  Positive for hearing loss. Negative for congestion, ear discharge, ear pain, nosebleeds, sinus pain, sore throat and tinnitus.   Eyes:  Positive for blurred vision. Negative for double vision, photophobia, pain, discharge and redness.  Respiratory: Negative.  Negative for stridor.   Cardiovascular:  Positive for palpitations. Negative for chest  pain, orthopnea, claudication, leg swelling and PND.  Gastrointestinal:  Positive for constipation, diarrhea and heartburn. Negative for abdominal pain, blood in stool, melena, nausea and vomiting.  Genitourinary: Negative.   Musculoskeletal: Negative.   Skin: Negative.   Neurological: Negative.   Endo/Heme/Allergies:  Positive for polydipsia. Negative for environmental allergies. Does not bruise/bleed easily.  Psychiatric/Behavioral: Negative.     All other ROS negative except what is listed above and in the HPI.      Objective:    BP 123/79   Pulse 73   Temp 97.9 F (36.6 C)   Ht 6' (1.829 m)   Wt 254 lb 9.6 oz (115.5 kg)   SpO2 98%   BMI 34.53 kg/m   Wt Readings from Last 3 Encounters:  03/31/23 254 lb 9.6 oz (115.5 kg)  01/26/23 252 lb (114.3 kg)  01/23/23 254 lb 6.4 oz (115.4 kg)    Physical Exam Vitals and nursing note reviewed.  Constitutional:      General: He is not in acute distress.    Appearance: Normal appearance. He is obese. He is not ill-appearing, toxic-appearing or diaphoretic.  HENT:     Head: Normocephalic and atraumatic.     Right Ear: Tympanic membrane, ear canal and external ear normal. There is no impacted cerumen.     Left Ear: Tympanic membrane, ear canal and external ear normal. There is no impacted cerumen.     Nose: Nose normal. No congestion or rhinorrhea.     Mouth/Throat:     Mouth: Mucous membranes are moist.     Pharynx: Oropharynx is clear. No oropharyngeal exudate or posterior oropharyngeal erythema.  Eyes:     General: No scleral icterus.       Right eye: No discharge.        Left eye: No discharge.     Extraocular Movements: Extraocular movements intact.     Conjunctiva/sclera: Conjunctivae normal.     Pupils: Pupils are equal, round, and reactive to light.  Neck:     Vascular: No carotid bruit.  Cardiovascular:     Rate and Rhythm: Normal rate and regular rhythm.     Pulses: Normal pulses.     Heart sounds: No murmur heard.     No friction rub. No gallop.  Pulmonary:     Effort: Pulmonary effort is normal. No respiratory distress.     Breath sounds: Normal breath sounds. No stridor. No wheezing, rhonchi or rales.  Chest:     Chest wall: No tenderness.  Abdominal:     General: Abdomen is flat. Bowel sounds are normal. There is no distension.     Palpations: Abdomen is soft. There is no  mass.     Tenderness: There is no abdominal tenderness. There is no right CVA tenderness, left CVA tenderness, guarding or rebound.     Hernia: No hernia is present.  Genitourinary:    Comments: Genital exam deferred with shared decision making Musculoskeletal:        General: No swelling, tenderness, deformity or signs of injury.     Cervical back: Normal range of motion and neck supple. No rigidity. No muscular tenderness.     Right lower leg: No edema.     Left lower leg: No edema.  Lymphadenopathy:     Cervical: No cervical adenopathy.  Skin:    General: Skin is warm and dry.     Capillary Refill: Capillary refill takes less than 2 seconds.     Coloration: Skin is not jaundiced or pale.     Findings: No bruising, erythema, lesion or rash.  Neurological:     General: No focal deficit present.     Mental Status: He is alert and oriented to person, place, and time.     Cranial Nerves: No cranial nerve deficit.     Sensory: No sensory deficit.     Motor: No weakness.     Coordination: Coordination normal.     Gait: Gait normal.     Deep Tendon Reflexes: Reflexes normal.  Psychiatric:        Mood and Affect: Mood normal.        Behavior: Behavior normal.        Thought Content: Thought content normal.        Judgment: Judgment normal.     Results for orders placed or performed in visit on 03/31/23  Urinalysis, Routine w reflex microscopic  Result Value Ref Range   Specific Gravity, UA >1.030 (H) 1.005 - 1.030   pH, UA 6.0 5.0 - 7.5   Color, UA Yellow Yellow   Appearance Ur Clear Clear   Leukocytes,UA Negative  Negative   Protein,UA Trace (A) Negative/Trace   Glucose, UA Negative Negative   Ketones, UA Negative Negative   RBC, UA Negative Negative   Bilirubin, UA Negative Negative   Urobilinogen, Ur 0.2 0.2 - 1.0 mg/dL   Nitrite, UA Negative Negative   Microscopic Examination Comment   Microalbumin, Urine Waived  Result Value Ref Range   Microalb, Ur Waived 30 (H) 0 - 19 mg/L   Creatinine, Urine Waived 300 10 - 300 mg/dL   Microalb/Creat Ratio <30 <30 mg/g      Assessment & Plan:   Problem List Items Addressed This Visit       Cardiovascular and Mediastinum   Essential hypertension    Under good control on current regimen. Continue current regimen. Continue to monitor. Call with any concerns. Refills given. Labs drawn today.       Relevant Medications   lisinopril-hydrochlorothiazide (ZESTORETIC) 20-25 MG tablet   Other Relevant Orders   Microalbumin, Urine Waived (Completed)     Digestive   GERD (gastroesophageal reflux disease)    Will start him on omeprazole. Call with any concerns. Continue to monitor.       Relevant Medications   omeprazole (PRILOSEC) 20 MG capsule   Other Visit Diagnoses     Routine general medical examination at a health care facility    -  Primary   Vaccines up to date. Sceening labs checked today. Continue diet and exercise. Call with any concerns.   Relevant Orders   Comprehensive metabolic panel   CBC with Differential/Platelet  Lipid Panel w/o Chol/HDL Ratio   TSH   Urinalysis, Routine w reflex microscopic (Completed)   Pain in both hands       Will reach back out to [facility] for EMG. Call with any concerns. Call with any concerns.        LABORATORY TESTING:  Health maintenance labs ordered today as discussed above.   IMMUNIZATIONS:   - Tdap: Tetanus vaccination status reviewed: last tetanus booster within 10 years. - Influenza: Postponed to flu season - Pneumovax: Not applicable - Prevnar: Not applicable - COVID: Up to  date - HPV: Not applicable - Shingrix vaccine: Not applicable  SCREENING: - Colonoscopy: Up to date  Discussed with patient purpose of the colonoscopy is to detect colon cancer at curable precancerous or early stages    PATIENT COUNSELING:    Sexuality: Discussed sexually transmitted diseases, partner selection, use of condoms, avoidance of unintended pregnancy  and contraceptive alternatives.   Advised to avoid cigarette smoking.  I discussed with the patient that most people either abstain from alcohol or drink within safe limits (<=14/week and <=4 drinks/occasion for males, <=7/weeks and <= 3 drinks/occasion for females) and that the risk for alcohol disorders and other health effects rises proportionally with the number of drinks per week and how often a drinker exceeds daily limits.  Discussed cessation/primary prevention of drug use and availability of treatment for abuse.   Diet: Encouraged to adjust caloric intake to maintain  or achieve ideal body weight, to reduce intake of dietary saturated fat and total fat, to limit sodium intake by avoiding high sodium foods and not adding table salt, and to maintain adequate dietary potassium and calcium preferably from fresh fruits, vegetables, and low-fat dairy products.    stressed the importance of regular exercise  Injury prevention: Discussed safety belts, safety helmets, smoke detector, smoking near bedding or upholstery.   Dental health: Discussed importance of regular tooth brushing, flossing, and dental visits.   Follow up plan: NEXT PREVENTATIVE PHYSICAL DUE IN 1 YEAR. Return in about 6 months (around 09/30/2023).

## 2023-03-31 NOTE — Assessment & Plan Note (Signed)
Will start him on omeprazole. Call with any concerns. Continue to monitor.

## 2023-03-31 NOTE — Assessment & Plan Note (Signed)
Under good control on current regimen. Continue current regimen. Continue to monitor. Call with any concerns. Refills given. Labs drawn today.   

## 2023-04-01 LAB — LIPID PANEL W/O CHOL/HDL RATIO
Cholesterol, Total: 174 mg/dL (ref 100–199)
HDL: 48 mg/dL (ref >39)
LDL Chol Calc (NIH): 101 mg/dL — ABNORMAL HIGH (ref 0–99)
Triglycerides: 143 mg/dL (ref 0–149)
VLDL Cholesterol Cal: 25 mg/dL (ref 5–40)

## 2023-04-01 LAB — CBC WITH DIFFERENTIAL/PLATELET
Basophils Absolute: 0.1 10*3/uL (ref 0.0–0.2)
Basos: 1 %
EOS (ABSOLUTE): 0.6 10*3/uL — ABNORMAL HIGH (ref 0.0–0.4)
Eos: 7 %
Hematocrit: 43.8 % (ref 37.5–51.0)
Hemoglobin: 14.7 g/dL (ref 13.0–17.7)
Immature Grans (Abs): 0 10*3/uL (ref 0.0–0.1)
Immature Granulocytes: 0 %
Lymphocytes Absolute: 2 10*3/uL (ref 0.7–3.1)
Lymphs: 27 %
MCH: 29.1 pg (ref 26.6–33.0)
MCHC: 33.6 g/dL (ref 31.5–35.7)
MCV: 87 fL (ref 79–97)
Monocytes Absolute: 0.6 10*3/uL (ref 0.1–0.9)
Monocytes: 8 %
Neutrophils Absolute: 4.2 10*3/uL (ref 1.4–7.0)
Neutrophils: 57 %
Platelets: 328 10*3/uL (ref 150–450)
RBC: 5.06 x10E6/uL (ref 4.14–5.80)
RDW: 13.2 % (ref 11.6–15.4)
WBC: 7.5 10*3/uL (ref 3.4–10.8)

## 2023-04-01 LAB — COMPREHENSIVE METABOLIC PANEL
ALT: 28 IU/L (ref 0–44)
AST: 20 IU/L (ref 0–40)
Albumin: 4.8 g/dL (ref 4.1–5.1)
Alkaline Phosphatase: 81 IU/L (ref 44–121)
BUN/Creatinine Ratio: 15 (ref 9–20)
BUN: 12 mg/dL (ref 6–20)
Bilirubin Total: 0.5 mg/dL (ref 0.0–1.2)
CO2: 25 mmol/L (ref 20–29)
Calcium: 9.5 mg/dL (ref 8.7–10.2)
Chloride: 97 mmol/L (ref 96–106)
Creatinine, Ser: 0.8 mg/dL (ref 0.76–1.27)
Globulin, Total: 2.5 g/dL (ref 1.5–4.5)
Glucose: 94 mg/dL (ref 70–99)
Potassium: 3.9 mmol/L (ref 3.5–5.2)
Sodium: 138 mmol/L (ref 134–144)
Total Protein: 7.3 g/dL (ref 6.0–8.5)
eGFR: 121 mL/min/{1.73_m2} (ref >59)

## 2023-04-01 LAB — TSH: TSH: 1.82 u[IU]/mL (ref 0.450–4.500)

## 2023-04-17 ENCOUNTER — Encounter: Payer: BC Managed Care – PPO | Admitting: Family Medicine

## 2023-04-20 ENCOUNTER — Encounter: Payer: Self-pay | Admitting: Family Medicine

## 2023-04-20 DIAGNOSIS — R251 Tremor, unspecified: Secondary | ICD-10-CM

## 2023-04-20 DIAGNOSIS — M79641 Pain in right hand: Secondary | ICD-10-CM

## 2023-04-29 ENCOUNTER — Encounter: Payer: Self-pay | Admitting: Family Medicine

## 2023-06-30 DIAGNOSIS — R200 Anesthesia of skin: Secondary | ICD-10-CM | POA: Diagnosis not present

## 2023-07-21 ENCOUNTER — Encounter: Payer: Self-pay | Admitting: Family Medicine

## 2023-07-21 ENCOUNTER — Ambulatory Visit: Payer: BC Managed Care – PPO | Admitting: Family Medicine

## 2023-07-21 VITALS — BP 115/73 | HR 86 | Ht 72.0 in | Wt 258.8 lb

## 2023-07-21 DIAGNOSIS — G4700 Insomnia, unspecified: Secondary | ICD-10-CM | POA: Diagnosis not present

## 2023-07-21 DIAGNOSIS — R41840 Attention and concentration deficit: Secondary | ICD-10-CM

## 2023-07-21 DIAGNOSIS — F419 Anxiety disorder, unspecified: Secondary | ICD-10-CM | POA: Diagnosis not present

## 2023-07-21 DIAGNOSIS — G5603 Carpal tunnel syndrome, bilateral upper limbs: Secondary | ICD-10-CM | POA: Insufficient documentation

## 2023-07-21 MED ORDER — TRAZODONE HCL 50 MG PO TABS: 25.0000 mg | ORAL_TABLET | Freq: Every evening | ORAL | 3 refills | Status: DC | PRN

## 2023-07-21 MED ORDER — BUPROPION HCL ER (SR) 150 MG PO TB12
ORAL_TABLET | ORAL | 3 refills | Status: DC
Start: 2023-07-21 — End: 2023-10-22

## 2023-07-21 NOTE — Assessment & Plan Note (Signed)
Acting up more. Will start him on wellbutrin. May need sertraline in the future to help with OCD symptoms. Call with any concerns. Recheck 1 month.

## 2023-07-21 NOTE — Progress Notes (Signed)
BP 115/73   Pulse 86   Ht 6' (1.829 m)   Wt 258 lb 12.8 oz (117.4 kg)   SpO2 99%   BMI 35.10 kg/m    Subjective:    Patient ID: Daniel Martinez, male    DOB: 1991/03/25, 32 y.o.   MRN: [id number]  HPI: Daniel Martinez is a 32 y.o. male  Chief Complaint  Patient presents with   Anxiety   Insomnia   Bad Memory   ADHD   ANXIETY/DEPRESSION Duration: chronic Status:exacerbated Anxious mood: yes  Excessive worrying: yes Irritability: no  Sweating: no Nausea: no Palpitations:no Hyperventilation: no Panic attacks: no Agoraphobia: no  Obscessions/compulsions: yes Depressed mood: yes    07/21/2023    9:07 AM 03/31/2023   10:07 AM 01/23/2023    8:18 AM 10/17/2022    9:02 AM 07/01/2022    9:10 AM  Depression screen PHQ 2/9  Decreased Interest 1 1 1 1 1   Down, Depressed, Hopeless 2 1 1 1  0  PHQ - 2 Score 3 2 2 2 1   Altered sleeping 2 1 1 1 1   Tired, decreased energy 3 1 1 2 2   Change in appetite 1 1 1  0 2  Feeling bad or failure about yourself  1 1 1 1 1   Trouble concentrating 2 1 1 1  0  Moving slowly or fidgety/restless 2 1 1 1  0  Suicidal thoughts 0 0 0 1 0  PHQ-9 Score 14 8 8 9 7   Difficult doing work/chores Somewhat difficult   Not difficult at all    Anhedonia: no Weight changes: no Insomnia: no   Hypersomnia: no Fatigue/loss of energy: no Feelings of worthlessness: no Feelings of guilt: no Impaired concentration/indecisiveness: yes Suicidal ideations: no  Crying spells: no Recent Stressors/Life Changes: no   Relationship problems: no   Family stress: no     Financial stress: no    Job stress: no    Recent death/loss: no  ADHD- diagnosed as a kid with ADHD, was never put on medicine.  ADHD status:  unsure Previous psychiatry evaluation: no Previous medications: no    Work/school performance:  good Difficulty sustaining attention/completing tasks: yes Distracted by extraneous stimuli: yes Does not listen when spoken to: no  Fidgets with hands  or feet: no Unable to stay in seat: no Blurts out/interrupts others: no ADHD Medication Side Effects: N/A  Diagnosed with CTS bilaterally- would like to get shot.   Relevant past medical, surgical, family and social history reviewed and updated as indicated. Interim medical history since our last visit reviewed. Allergies and medications reviewed and updated.  Review of Systems  Constitutional: Negative.   Respiratory: Negative.    Cardiovascular: Negative.   Gastrointestinal: Negative.   Musculoskeletal: Negative.   Skin: Negative.   Neurological:  Positive for tremors. Negative for dizziness, seizures, syncope, facial asymmetry, speech difficulty, weakness, light-headedness, numbness and headaches.  Psychiatric/Behavioral:  Positive for decreased concentration and dysphoric mood. Negative for agitation, behavioral problems, confusion, hallucinations, self-injury, sleep disturbance and suicidal ideas. The patient is nervous/anxious. The patient is not hyperactive.     Per HPI unless specifically indicated above     Objective:    BP 115/73   Pulse 86   Ht 6' (1.829 m)   Wt 258 lb 12.8 oz (117.4 kg)   SpO2 99%   BMI 35.10 kg/m   Wt Readings from Last 3 Encounters:  07/21/23 258 lb 12.8 oz (117.4 kg)  03/31/23 254 lb 9.6 oz (115.5  kg)  01/26/23 252 lb (114.3 kg)    Physical Exam Vitals and nursing note reviewed.  Constitutional:      General: He is not in acute distress.    Appearance: Normal appearance. He is obese. He is not ill-appearing, toxic-appearing or diaphoretic.  HENT:     Head: Normocephalic and atraumatic.     Right Ear: External ear normal.     Left Ear: External ear normal.     Nose: Nose normal.     Mouth/Throat:     Mouth: Mucous membranes are moist.     Pharynx: Oropharynx is clear.  Eyes:     General: No scleral icterus.       Right eye: No discharge.        Left eye: No discharge.     Extraocular Movements: Extraocular movements intact.      Conjunctiva/sclera: Conjunctivae normal.     Pupils: Pupils are equal, round, and reactive to light.  Cardiovascular:     Rate and Rhythm: Normal rate and regular rhythm.     Pulses: Normal pulses.     Heart sounds: Normal heart sounds. No murmur heard.    No friction rub. No gallop.  Pulmonary:     Effort: Pulmonary effort is normal. No respiratory distress.     Breath sounds: Normal breath sounds. No stridor. No wheezing, rhonchi or rales.  Chest:     Chest wall: No tenderness.  Musculoskeletal:        General: Normal range of motion.     Cervical back: Normal range of motion and neck supple.  Skin:    General: Skin is warm and dry.     Capillary Refill: Capillary refill takes less than 2 seconds.     Coloration: Skin is not jaundiced or pale.     Findings: No bruising, erythema, lesion or rash.  Neurological:     General: No focal deficit present.     Mental Status: He is alert and oriented to person, place, and time. Mental status is at baseline.  Psychiatric:        Mood and Affect: Mood normal.        Behavior: Behavior normal.        Thought Content: Thought content normal.        Judgment: Judgment normal.     Results for orders placed or performed in visit on 03/31/23  Comprehensive metabolic panel  Result Value Ref Range   Glucose 94 70 - 99 mg/dL   BUN 12 6 - 20 mg/dL   Creatinine, Ser 1.61 0.76 - 1.27 mg/dL   eGFR 096 >04 VW/UJW/1.19   BUN/Creatinine Ratio 15 9 - 20   Sodium 138 134 - 144 mmol/L   Potassium 3.9 3.5 - 5.2 mmol/L   Chloride 97 96 - 106 mmol/L   CO2 25 20 - 29 mmol/L   Calcium 9.5 8.7 - 10.2 mg/dL   Total Protein 7.3 6.0 - 8.5 g/dL   Albumin 4.8 4.1 - 5.1 g/dL   Globulin, Total 2.5 1.5 - 4.5 g/dL   Bilirubin Total 0.5 0.0 - 1.2 mg/dL   Alkaline Phosphatase 81 44 - 121 IU/L   AST 20 0 - 40 IU/L   ALT 28 0 - 44 IU/L  CBC with Differential/Platelet  Result Value Ref Range   WBC 7.5 3.4 - 10.8 x10E3/uL   RBC 5.06 4.14 - 5.80 x10E6/uL    Hemoglobin 14.7 13.0 - 17.7 g/dL   Hematocrit 14.7 82.9 - 51.0 %  MCV 87 79 - 97 fL   MCH 29.1 26.6 - 33.0 pg   MCHC 33.6 31.5 - 35.7 g/dL   RDW 82.9 56.2 - 13.0 %   Platelets 328 150 - 450 x10E3/uL   Neutrophils 57 Not Estab. %   Lymphs 27 Not Estab. %   Monocytes 8 Not Estab. %   Eos 7 Not Estab. %   Basos 1 Not Estab. %   Neutrophils Absolute 4.2 1.4 - 7.0 x10E3/uL   Lymphocytes Absolute 2.0 0.7 - 3.1 x10E3/uL   Monocytes Absolute 0.6 0.1 - 0.9 x10E3/uL   EOS (ABSOLUTE) 0.6 (H) 0.0 - 0.4 x10E3/uL   Basophils Absolute 0.1 0.0 - 0.2 x10E3/uL   Immature Granulocytes 0 Not Estab. %   Immature Grans (Abs) 0.0 0.0 - 0.1 x10E3/uL  Lipid Panel w/o Chol/HDL Ratio  Result Value Ref Range   Cholesterol, Total 174 100 - 199 mg/dL   Triglycerides 865 0 - 149 mg/dL   HDL 48 >78 mg/dL   VLDL Cholesterol Cal 25 5 - 40 mg/dL   LDL Chol Calc (NIH) 469 (H) 0 - 99 mg/dL  TSH  Result Value Ref Range   TSH 1.820 0.450 - 4.500 uIU/mL  Urinalysis, Routine w reflex microscopic  Result Value Ref Range   Specific Gravity, UA >1.030 (H) 1.005 - 1.030   pH, UA 6.0 5.0 - 7.5   Color, UA Yellow Yellow   Appearance Ur Clear Clear   Leukocytes,UA Negative Negative   Protein,UA Trace (A) Negative/Trace   Glucose, UA Negative Negative   Ketones, UA Negative Negative   RBC, UA Negative Negative   Bilirubin, UA Negative Negative   Urobilinogen, Ur 0.2 0.2 - 1.0 mg/dL   Nitrite, UA Negative Negative   Microscopic Examination Comment   Microalbumin, Urine Waived  Result Value Ref Range   Microalb, Ur Waived 30 (H) 0 - 19 mg/L   Creatinine, Urine Waived 300 10 - 300 mg/dL   Microalb/Creat Ratio <30 <30 mg/g      Assessment & Plan:   Problem List Items Addressed This Visit       Nervous and Auditory   Bilateral carpal tunnel syndrome    Referral to hand specialist for evaluation for injection.       Relevant Medications   buPROPion (WELLBUTRIN SR) 150 MG 12 hr tablet   traZODone (DESYREL)  50 MG tablet   Other Relevant Orders   Ambulatory referral to Hand Surgery     Other   Anxiety - Primary    Acting up more. Will start him on wellbutrin. May need sertraline in the future to help with OCD symptoms. Call with any concerns. Recheck 1 month.       Relevant Medications   buPROPion (WELLBUTRIN SR) 150 MG 12 hr tablet   traZODone (DESYREL) 50 MG tablet   Other Relevant Orders   Ambulatory referral to Neuropsychology   Other Visit Diagnoses     Concentration deficit       Will refer to neuropsych for testing. Start wellbutrin until evaluated.   Relevant Orders   Ambulatory referral to Neuropsychology   Insomnia, unspecified type       Not under good control. Will start him on wellbutrin and recheck 1 month.        Follow up plan: Return in about 4 weeks (around 08/18/2023).

## 2023-07-21 NOTE — Assessment & Plan Note (Signed)
Referral to hand specialist for evaluation for injection.

## 2023-07-28 ENCOUNTER — Encounter: Payer: Self-pay | Admitting: Family Medicine

## 2023-07-31 ENCOUNTER — Ambulatory Visit: Payer: BC Managed Care – PPO | Admitting: Family Medicine

## 2023-08-11 DIAGNOSIS — G5601 Carpal tunnel syndrome, right upper limb: Secondary | ICD-10-CM | POA: Diagnosis not present

## 2023-08-17 ENCOUNTER — Ambulatory Visit: Payer: BC Managed Care – PPO | Admitting: Family Medicine

## 2023-08-17 ENCOUNTER — Encounter: Payer: Self-pay | Admitting: Family Medicine

## 2023-08-17 VITALS — Ht 72.0 in | Wt 257.6 lb

## 2023-08-17 DIAGNOSIS — F422 Mixed obsessional thoughts and acts: Secondary | ICD-10-CM | POA: Diagnosis not present

## 2023-08-17 DIAGNOSIS — G5603 Carpal tunnel syndrome, bilateral upper limbs: Secondary | ICD-10-CM

## 2023-08-17 DIAGNOSIS — R251 Tremor, unspecified: Secondary | ICD-10-CM

## 2023-08-17 DIAGNOSIS — E559 Vitamin D deficiency, unspecified: Secondary | ICD-10-CM | POA: Insufficient documentation

## 2023-08-17 MED ORDER — ESCITALOPRAM OXALATE 5 MG PO TABS
2.5000 mg | ORAL_TABLET | Freq: Every day | ORAL | 2 refills | Status: DC
Start: 2023-08-17 — End: 2023-10-22

## 2023-08-17 NOTE — Assessment & Plan Note (Signed)
Checking labs. Await results. Treat as needed.

## 2023-08-17 NOTE — Assessment & Plan Note (Signed)
Not doing great. Will continue wellbutrin and add in lexapro. Recheck in about a month. Call with any concerns.

## 2023-08-17 NOTE — Progress Notes (Signed)
Ht 6' (1.829 m)   Wt 257 lb 9.6 oz (116.8 kg)   SpO2 98%   BMI 34.94 kg/m    Subjective:    Patient ID: Daniel Martinez, male    DOB: Nov 12, 1990, 32 y.o.   MRN: [id number]  HPI: Daniel Martinez is a 32 y.o. male  Chief Complaint  Patient presents with   Anxiety   Concerned that he has ALS Varicose veins on his legs  OCD Duration: chronic Status:uncontrolled Anxious mood: yes  Excessive worrying: yes Irritability: no  Sweating: no Nausea: no Palpitations:no Hyperventilation: no Panic attacks: no Agoraphobia: no  Obscessions/compulsions: yes Depressed mood: yes    08/17/2023    9:30 AM 07/21/2023    9:07 AM 03/31/2023   10:07 AM 01/23/2023    8:18 AM 10/17/2022    9:02 AM  Depression screen PHQ 2/9  Decreased Interest 1 1 1 1 1   Down, Depressed, Hopeless 1 2 1 1 1   PHQ - 2 Score 2 3 2 2 2   Altered sleeping 1 2 1 1 1   Tired, decreased energy 1 3 1 1 2   Change in appetite 1 1 1 1  0  Feeling bad or failure about yourself  1 1 1 1 1   Trouble concentrating 1 2 1 1 1   Moving slowly or fidgety/restless 1 2 1 1 1   Suicidal thoughts 0 0 0 0 1  PHQ-9 Score 8 14 8 8 9   Difficult doing work/chores Somewhat difficult Somewhat difficult   Not difficult at all      08/17/2023    9:30 AM 07/21/2023    9:07 AM 03/31/2023   10:08 AM 01/23/2023    8:18 AM  GAD 7 : Generalized Anxiety Score  Nervous, Anxious, on Edge 2 2 1 1   Control/stop worrying 2 2 2 1   Worry too much - different things 2 2 1 1   Trouble relaxing 2 2 1 1   Restless 2 2 1 1   Easily annoyed or irritable 2 2 1 1   Afraid - awful might happen 2 2 1 1   Total GAD 7 Score 14 14 8 7   Anxiety Difficulty Somewhat difficult Somewhat difficult Not difficult at all    Anhedonia: no Weight changes: no Insomnia: no   Hypersomnia: no Fatigue/loss of energy: yes Feelings of worthlessness: no Feelings of guilt: no Impaired concentration/indecisiveness: no Suicidal ideations: no  Crying spells: no Recent  Stressors/Life Changes: no   Relationship problems: no   Family stress: no     Financial stress: no    Job stress: no    Recent death/loss: no   Relevant past medical, surgical, family and social history reviewed and updated as indicated. Interim medical history since our last visit reviewed. Allergies and medications reviewed and updated.  Review of Systems  Constitutional: Negative.   Respiratory: Negative.    Cardiovascular: Negative.   Musculoskeletal: Negative.   Psychiatric/Behavioral:  Positive for dysphoric mood. Negative for agitation, behavioral problems, confusion, decreased concentration, hallucinations, self-injury, sleep disturbance and suicidal ideas. The patient is nervous/anxious. The patient is not hyperactive.     Per HPI unless specifically indicated above     Objective:    Ht 6' (1.829 m)   Wt 257 lb 9.6 oz (116.8 kg)   SpO2 98%   BMI 34.94 kg/m   Wt Readings from Last 3 Encounters:  08/17/23 257 lb 9.6 oz (116.8 kg)  07/21/23 258 lb 12.8 oz (117.4 kg)  03/31/23 254 lb 9.6 oz (115.5 kg)  Physical Exam Vitals and nursing note reviewed.  Constitutional:      General: He is not in acute distress.    Appearance: Normal appearance. He is obese. He is not ill-appearing, toxic-appearing or diaphoretic.  HENT:     Head: Normocephalic and atraumatic.     Right Ear: External ear normal.     Left Ear: External ear normal.     Nose: Nose normal.     Mouth/Throat:     Mouth: Mucous membranes are moist.     Pharynx: Oropharynx is clear.  Eyes:     General: No scleral icterus.       Right eye: No discharge.        Left eye: No discharge.     Extraocular Movements: Extraocular movements intact.     Conjunctiva/sclera: Conjunctivae normal.     Pupils: Pupils are equal, round, and reactive to light.  Cardiovascular:     Rate and Rhythm: Normal rate and regular rhythm.     Pulses: Normal pulses.     Heart sounds: Normal heart sounds. No murmur heard.     No friction rub. No gallop.  Pulmonary:     Effort: Pulmonary effort is normal. No respiratory distress.     Breath sounds: Normal breath sounds. No stridor. No wheezing, rhonchi or rales.  Chest:     Chest wall: No tenderness.  Musculoskeletal:        General: Normal range of motion.     Cervical back: Normal range of motion and neck supple.  Skin:    General: Skin is warm and dry.     Capillary Refill: Capillary refill takes less than 2 seconds.     Coloration: Skin is not jaundiced or pale.     Findings: No bruising, erythema, lesion or rash.  Neurological:     General: No focal deficit present.     Mental Status: He is alert and oriented to person, place, and time. Mental status is at baseline.  Psychiatric:        Mood and Affect: Mood normal.        Behavior: Behavior normal.        Thought Content: Thought content normal.        Judgment: Judgment normal.     Results for orders placed or performed in visit on 03/31/23  Comprehensive metabolic panel  Result Value Ref Range   Glucose 94 70 - 99 mg/dL   BUN 12 6 - 20 mg/dL   Creatinine, Ser 1.61 0.76 - 1.27 mg/dL   eGFR 096 >04 VW/UJW/1.19   BUN/Creatinine Ratio 15 9 - 20   Sodium 138 134 - 144 mmol/L   Potassium 3.9 3.5 - 5.2 mmol/L   Chloride 97 96 - 106 mmol/L   CO2 25 20 - 29 mmol/L   Calcium 9.5 8.7 - 10.2 mg/dL   Total Protein 7.3 6.0 - 8.5 g/dL   Albumin 4.8 4.1 - 5.1 g/dL   Globulin, Total 2.5 1.5 - 4.5 g/dL   Bilirubin Total 0.5 0.0 - 1.2 mg/dL   Alkaline Phosphatase 81 44 - 121 IU/L   AST 20 0 - 40 IU/L   ALT 28 0 - 44 IU/L  CBC with Differential/Platelet  Result Value Ref Range   WBC 7.5 3.4 - 10.8 x10E3/uL   RBC 5.06 4.14 - 5.80 x10E6/uL   Hemoglobin 14.7 13.0 - 17.7 g/dL   Hematocrit 14.7 82.9 - 51.0 %   MCV 87 79 - 97 fL  MCH 29.1 26.6 - 33.0 pg   MCHC 33.6 31.5 - 35.7 g/dL   RDW 17.6 16.0 - 73.7 %   Platelets 328 150 - 450 x10E3/uL   Neutrophils 57 Not Estab. %   Lymphs 27 Not Estab. %    Monocytes 8 Not Estab. %   Eos 7 Not Estab. %   Basos 1 Not Estab. %   Neutrophils Absolute 4.2 1.4 - 7.0 x10E3/uL   Lymphocytes Absolute 2.0 0.7 - 3.1 x10E3/uL   Monocytes Absolute 0.6 0.1 - 0.9 x10E3/uL   EOS (ABSOLUTE) 0.6 (H) 0.0 - 0.4 x10E3/uL   Basophils Absolute 0.1 0.0 - 0.2 x10E3/uL   Immature Granulocytes 0 Not Estab. %   Immature Grans (Abs) 0.0 0.0 - 0.1 x10E3/uL  Lipid Panel w/o Chol/HDL Ratio  Result Value Ref Range   Cholesterol, Total 174 100 - 199 mg/dL   Triglycerides 106 0 - 149 mg/dL   HDL 48 >26 mg/dL   VLDL Cholesterol Cal 25 5 - 40 mg/dL   LDL Chol Calc (NIH) 948 (H) 0 - 99 mg/dL  TSH  Result Value Ref Range   TSH 1.820 0.450 - 4.500 uIU/mL  Urinalysis, Routine w reflex microscopic  Result Value Ref Range   Specific Gravity, UA >1.030 (H) 1.005 - 1.030   pH, UA 6.0 5.0 - 7.5   Color, UA Yellow Yellow   Appearance Ur Clear Clear   Leukocytes,UA Negative Negative   Protein,UA Trace (A) Negative/Trace   Glucose, UA Negative Negative   Ketones, UA Negative Negative   RBC, UA Negative Negative   Bilirubin, UA Negative Negative   Urobilinogen, Ur 0.2 0.2 - 1.0 mg/dL   Nitrite, UA Negative Negative   Microscopic Examination Comment   Microalbumin, Urine Waived  Result Value Ref Range   Microalb, Ur Waived 30 (H) 0 - 19 mg/L   Creatinine, Urine Waived 300 10 - 300 mg/dL   Microalb/Creat Ratio <30 <30 mg/g      Assessment & Plan:   Problem List Items Addressed This Visit       Nervous and Auditory   Bilateral carpal tunnel syndrome   Relevant Medications   escitalopram (LEXAPRO) 5 MG tablet   Other Relevant Orders   Ambulatory referral to Neurology     Other   OCD (obsessive compulsive disorder)    Not doing great. Will continue wellbutrin and add in lexapro. Recheck in about a month. Call with any concerns.       Vitamin D deficiency - Primary    Checking labs. Await results. Treat as needed.       Relevant Orders   VITAMIN D 25 Hydroxy  (Vit-D Deficiency, Fractures)   Other Visit Diagnoses     Tremor       Not improved. Would like to see neurology. Referral placed.   Relevant Orders   Ambulatory referral to Neurology        Follow up plan: Return in about 4 weeks (around 09/14/2023).

## 2023-08-18 ENCOUNTER — Ambulatory Visit: Payer: BC Managed Care – PPO | Admitting: Family Medicine

## 2023-08-18 LAB — VITAMIN D 25 HYDROXY (VIT D DEFICIENCY, FRACTURES): Vit D, 25-Hydroxy: 41.6 ng/mL (ref 30.0–100.0)

## 2023-08-26 ENCOUNTER — Encounter: Payer: Self-pay | Admitting: Family Medicine

## 2023-09-02 NOTE — Telephone Encounter (Signed)
Pt is scheduled on 09/07/2023 for this appointment.

## 2023-09-07 ENCOUNTER — Ambulatory Visit: Payer: BC Managed Care – PPO | Admitting: Family Medicine

## 2023-09-25 ENCOUNTER — Ambulatory Visit: Payer: BC Managed Care – PPO | Admitting: Family Medicine

## 2023-10-22 ENCOUNTER — Encounter: Payer: Self-pay | Admitting: Family Medicine

## 2023-10-22 ENCOUNTER — Ambulatory Visit: Payer: BC Managed Care – PPO | Admitting: Family Medicine

## 2023-10-22 VITALS — BP 138/71 | HR 81 | Temp 98.6°F | Wt 260.4 lb

## 2023-10-22 DIAGNOSIS — I10 Essential (primary) hypertension: Secondary | ICD-10-CM

## 2023-10-22 DIAGNOSIS — E559 Vitamin D deficiency, unspecified: Secondary | ICD-10-CM

## 2023-10-22 DIAGNOSIS — F422 Mixed obsessional thoughts and acts: Secondary | ICD-10-CM | POA: Diagnosis not present

## 2023-10-22 MED ORDER — LISINOPRIL-HYDROCHLOROTHIAZIDE 20-25 MG PO TABS: 1.0000 | ORAL_TABLET | Freq: Every day | ORAL | 1 refills | Status: DC

## 2023-10-22 MED ORDER — TRAZODONE HCL 50 MG PO TABS: 25.0000 mg | ORAL_TABLET | Freq: Every evening | ORAL | 1 refills | Status: DC | PRN

## 2023-10-22 MED ORDER — BUPROPION HCL ER (SR) 150 MG PO TB12: 150.0000 mg | ORAL_TABLET | Freq: Two times a day (BID) | ORAL | 1 refills | Status: DC

## 2023-10-22 NOTE — Assessment & Plan Note (Signed)
Under good control on current regimen. Continue current regimen. Continue to monitor. Call with any concerns. Refills given. Labs drawn today.   

## 2023-10-22 NOTE — Assessment & Plan Note (Signed)
Rechecking labs today. Await results. Treat as needed.  °

## 2023-10-22 NOTE — Progress Notes (Signed)
BP 138/71   Pulse 81   Temp 98.6 F (37 C)   Wt 260 lb 6.4 oz (118.1 kg)   SpO2 98%   BMI 35.32 kg/m    Subjective:    Patient ID: Daniel Martinez, male    DOB: [birth year], 33 y.o.   MRN: [id number]  HPI: [name] is a 33 y.o. male  Chief Complaint  Patient presents with   OCD    Patient says he stopped his Lexapro prescription due to a rash. Patient says he did not want to restart the medication, as he has been doing well with Wellbutrin.    Hypertension   ANXIETY/STRESS Duration: chronic Status:controlled Anxious mood: no  Excessive worrying: no Irritability: no  Sweating: no Nausea: no Palpitations:no Hyperventilation: no Panic attacks: no Agoraphobia: no  Obscessions/compulsions: no Depressed mood: no    10/22/2023    1:18 PM 08/17/2023    9:30 AM 07/21/2023    9:07 AM 03/31/2023   10:07 AM 01/23/2023    8:18 AM  Depression screen PHQ 2/9  Decreased Interest 1 1 1 1 1   Down, Depressed, Hopeless 1 1 2 1 1   PHQ - 2 Score 2 2 3 2 2   Altered sleeping 1 1 2 1 1   Tired, decreased energy 1 1 3 1 1   Change in appetite 2 1 1 1 1   Feeling bad or failure about yourself  1 1 1 1 1   Trouble concentrating 1 1 2 1 1   Moving slowly or fidgety/restless 1 1 2 1 1   Suicidal thoughts 1 0 0 0 0  PHQ-9 Score 10 8 14 8 8   Difficult doing work/chores Not difficult at all Somewhat difficult Somewhat difficult     Anhedonia: no Weight changes: no Insomnia: no   Hypersomnia: no Fatigue/loss of energy: no Feelings of worthlessness: no Feelings of guilt: no Impaired concentration/indecisiveness: no Suicidal ideations: no  Crying spells: no Recent Stressors/Life Changes: no   Relationship problems: no   Family stress: no     Financial stress: no    Job stress: no    Recent death/loss: no  HYPERTENSION  Hypertension status: controlled  Satisfied with current treatment? yes Duration of hypertension: chronic BP monitoring frequency:  not checking BP  medication side effects:  no Medication compliance: excellent compliance Previous BP meds:lisinopril-HCTZ Aspirin: no Recurrent headaches: no Visual changes: no Palpitations: no Dyspnea: no Chest pain: no Lower extremity edema: no Dizzy/lightheaded: no  Relevant past medical, surgical, family and social history reviewed and updated as indicated. Interim medical history since our last visit reviewed. Allergies and medications reviewed and updated.  Review of Systems  Constitutional: Negative.   Respiratory: Negative.    Cardiovascular: Negative.   Musculoskeletal: Negative.   Neurological: Negative.   Psychiatric/Behavioral: Negative.      Per HPI unless specifically indicated above     Objective:    BP 138/71   Pulse 81   Temp 98.6 F (37 C)   Wt 260 lb 6.4 oz (118.1 kg)   SpO2 98%   BMI 35.32 kg/m   Wt Readings from Last 3 Encounters:  10/22/23 260 lb 6.4 oz (118.1 kg)  08/17/23 257 lb 9.6 oz (116.8 kg)  07/21/23 258 lb 12.8 oz (117.4 kg)    Physical Exam Vitals and nursing note reviewed.  Constitutional:      General: He is not in acute distress.    Appearance: Normal appearance. He is obese. He is not ill-appearing, toxic-appearing or diaphoretic.  HENT:     Head: Normocephalic and atraumatic.     Right Ear: External ear normal.     Left Ear: External ear normal.     Nose: Nose normal.     Mouth/Throat:     Mouth: Mucous membranes are moist.     Pharynx: Oropharynx is clear.  Eyes:     General: No scleral icterus.       Right eye: No discharge.        Left eye: No discharge.     Extraocular Movements: Extraocular movements intact.     Conjunctiva/sclera: Conjunctivae normal.     Pupils: Pupils are equal, round, and reactive to light.  Cardiovascular:     Rate and Rhythm: Normal rate and regular rhythm.     Pulses: Normal pulses.     Heart sounds: Normal heart sounds. No murmur heard.    No friction rub. No gallop.  Pulmonary:     Effort:  Pulmonary effort is normal. No respiratory distress.     Breath sounds: Normal breath sounds. No stridor. No wheezing, rhonchi or rales.  Chest:     Chest wall: No tenderness.  Musculoskeletal:        General: Normal range of motion.     Cervical back: Normal range of motion and neck supple.  Skin:    General: Skin is warm and dry.     Capillary Refill: Capillary refill takes less than 2 seconds.     Coloration: Skin is not jaundiced or pale.     Findings: No bruising, erythema, lesion or rash.  Neurological:     General: No focal deficit present.     Mental Status: He is alert and oriented to person, place, and time. Mental status is at baseline.  Psychiatric:        Mood and Affect: Mood normal.        Behavior: Behavior normal.        Thought Content: Thought content normal.        Judgment: Judgment normal.     Results for orders placed or performed in visit on 08/17/23  VITAMIN D 25 Hydroxy (Vit-D Deficiency, Fractures)   Collection Time: 08/17/23  9:59 AM  Result Value Ref Range   Vit D, 25-Hydroxy 41.6 30.0 - 100.0 ng/mL      Assessment & Plan:   Problem List Items Addressed This Visit       Cardiovascular and Mediastinum   Essential hypertension - Primary   Under good control on current regimen. Continue current regimen. Continue to monitor. Call with any concerns. Refills given. Labs drawn today.        Relevant Medications   lisinopril-hydrochlorothiazide (ZESTORETIC) 20-25 MG tablet   Other Relevant Orders   CBC with Differential/Platelet   Basic metabolic panel     Other   OCD (obsessive compulsive disorder)   Under good control on current regimen. Continue current regimen. Continue to monitor. Call with any concerns. Refills given. Labs drawn today.       Relevant Orders   CBC with Differential/Platelet   Vitamin D deficiency   Rechecking labs today. Await results. Treat as needed.       Relevant Orders   CBC with Differential/Platelet    VITAMIN D 25 Hydroxy (Vit-D Deficiency, Fractures)     Follow up plan: Return in about 6 months (around 04/20/2024) for physical.

## 2023-10-23 LAB — CBC WITH DIFFERENTIAL/PLATELET
Basophils Absolute: 0.1 10*3/uL (ref 0.0–0.2)
Basos: 1 %
EOS (ABSOLUTE): 0.4 10*3/uL (ref 0.0–0.4)
Eos: 5 %
Hematocrit: 44.4 % (ref 37.5–51.0)
Hemoglobin: 15.5 g/dL (ref 13.0–17.7)
Immature Grans (Abs): 0 10*3/uL (ref 0.0–0.1)
Immature Granulocytes: 1 %
Lymphocytes Absolute: 1.6 10*3/uL (ref 0.7–3.1)
Lymphs: 24 %
MCH: 29.9 pg (ref 26.6–33.0)
MCHC: 34.9 g/dL (ref 31.5–35.7)
MCV: 86 fL (ref 79–97)
Monocytes Absolute: 0.7 10*3/uL (ref 0.1–0.9)
Monocytes: 10 %
Neutrophils Absolute: 3.9 10*3/uL (ref 1.4–7.0)
Neutrophils: 59 %
Platelets: 337 10*3/uL (ref 150–450)
RBC: 5.19 x10E6/uL (ref 4.14–5.80)
RDW: 12.5 % (ref 11.6–15.4)
WBC: 6.6 10*3/uL (ref 3.4–10.8)

## 2023-10-23 LAB — BASIC METABOLIC PANEL
BUN/Creatinine Ratio: 14 (ref 9–20)
BUN: 13 mg/dL (ref 6–20)
CO2: 24 mmol/L (ref 20–29)
Calcium: 9.7 mg/dL (ref 8.7–10.2)
Chloride: 94 mmol/L — ABNORMAL LOW (ref 96–106)
Creatinine, Ser: 0.91 mg/dL (ref 0.76–1.27)
Glucose: 98 mg/dL (ref 70–99)
Potassium: 4 mmol/L (ref 3.5–5.2)
Sodium: 135 mmol/L (ref 134–144)
eGFR: 115 mL/min/{1.73_m2} (ref >59)

## 2023-10-23 LAB — VITAMIN D 25 HYDROXY (VIT D DEFICIENCY, FRACTURES): Vit D, 25-Hydroxy: 47.7 ng/mL (ref 30.0–100.0)

## 2023-10-25 ENCOUNTER — Encounter: Payer: Self-pay | Admitting: Family Medicine

## 2023-12-01 ENCOUNTER — Other Ambulatory Visit: Payer: Self-pay | Admitting: Family Medicine

## 2023-12-02 NOTE — Telephone Encounter (Signed)
 Requested too soon. Last ordered 10/22/23, #90, 1 refill. Requested Prescriptions  Pending Prescriptions Disp Refills   lisinopril-hydrochlorothiazide (ZESTORETIC) 20-25 MG tablet [Pharmacy Med Name: LISINOPRIL-HCTZ 20/25MG  TABLETS] 90 tablet 1    Sig: TAKE 1 TABLET BY MOUTH DAILY     Cardiovascular:  ACEI + Diuretic Combos Passed - 12/02/2023  9:58 AM      Passed - Na in normal range and within 180 days    Sodium  Date Value Ref Range Status  10/22/2023 135 134 - 144 mmol/L Final         Passed - K in normal range and within 180 days    Potassium  Date Value Ref Range Status  10/22/2023 4.0 3.5 - 5.2 mmol/L Final         Passed - Cr in normal range and within 180 days    Creatinine, Ser  Date Value Ref Range Status  10/22/2023 0.91 0.76 - 1.27 mg/dL Final         Passed - eGFR is 30 or above and within 180 days    GFR calc Af Amer  Date Value Ref Range Status  07/17/2020 118 >59 mL/min/1.73 Final    Comment:    **[facility] currently reports eGFR in compliance with the current**   recommendations of the [employer]. [facility] will   update reporting as new guidelines are published from the NKF-ASN   Task force.    GFR calc non Af Amer  Date Value Ref Range Status  07/17/2020 102 >59 mL/min/1.73 Final   eGFR  Date Value Ref Range Status  10/22/2023 115 >59 mL/min/1.73 Final         Passed - Patient is not pregnant      Passed - Last BP in normal range    BP Readings from Last 1 Encounters:  10/22/23 138/71         Passed - Valid encounter within last 6 months    Recent Outpatient Visits           1 month ago Essential hypertension   [facility] [place], [place], DO   3 months ago Vitamin D deficiency   [place] [facility] [place], [place], DO   4 months ago Anxiety   [facility] [place], Megan P, DO   8 months ago Routine general medical examination at a health care facility    [facility] [place], Megan P, DO   10 months ago Great toe pain, right   [facility], Dorie Rank, NP       Future Appointments             In 4 months Laural Benes, Oralia Rud, DO [facility]

## 2023-12-02 NOTE — Telephone Encounter (Signed)
 [facility] called and spoke to [place], [employer] about the refill(s) Lisinopril- Hydrochlorothiazide requested. Advised it was sent on 10/22/23 #90/1 refill(s). Fayrene Fearing stated the Rx is ready for pick and a new order is not needed. [facility] will notify patient the medication is ready for pick-up.

## 2023-12-07 DIAGNOSIS — G250 Essential tremor: Secondary | ICD-10-CM | POA: Diagnosis not present

## 2023-12-07 DIAGNOSIS — R41840 Attention and concentration deficit: Secondary | ICD-10-CM | POA: Diagnosis not present

## 2023-12-07 DIAGNOSIS — G5603 Carpal tunnel syndrome, bilateral upper limbs: Secondary | ICD-10-CM | POA: Diagnosis not present

## 2023-12-07 DIAGNOSIS — R200 Anesthesia of skin: Secondary | ICD-10-CM | POA: Diagnosis not present

## 2023-12-11 ENCOUNTER — Encounter: Payer: Self-pay | Admitting: Family Medicine

## 2023-12-14 ENCOUNTER — Other Ambulatory Visit: Payer: Self-pay | Admitting: Neurology

## 2023-12-14 DIAGNOSIS — Z711 Person with feared health complaint in whom no diagnosis is made: Secondary | ICD-10-CM

## 2023-12-22 ENCOUNTER — Ambulatory Visit
Admission: RE | Admit: 2023-12-22 | Discharge: 2023-12-22 | Disposition: A | Discharge status: Home / Self Care | Source: Ambulatory Visit | Attending: Neurology | Admitting: Neurology

## 2023-12-22 ENCOUNTER — Ambulatory Visit: Admitting: Family Medicine

## 2023-12-22 VITALS — BP 113/68 | HR 79 | Temp 98.3°F | Resp 15 | Ht 72.01 in | Wt 254.8 lb

## 2023-12-22 DIAGNOSIS — Z711 Person with feared health complaint in whom no diagnosis is made: Secondary | ICD-10-CM | POA: Diagnosis not present

## 2023-12-22 DIAGNOSIS — R251 Tremor, unspecified: Secondary | ICD-10-CM | POA: Diagnosis not present

## 2023-12-22 DIAGNOSIS — J329 Chronic sinusitis, unspecified: Secondary | ICD-10-CM | POA: Diagnosis not present

## 2023-12-22 DIAGNOSIS — N62 Hypertrophy of breast: Secondary | ICD-10-CM

## 2023-12-22 MED ORDER — GADOBUTROL 1 MMOL/ML IV SOLN
10.0000 mL | Freq: Once | INTRAVENOUS | Status: AC | PRN
  Administered 2023-12-22: 10 mL via INTRAVENOUS

## 2023-12-22 NOTE — Progress Notes (Signed)
 BP 113/68 (BP Location: Left Arm, Patient Position: Sitting, Cuff Size: Large)   Pulse 79   Temp 98.3 F (36.8 C) (Oral)   Resp 15   Ht 6' 0.01" (1.829 m)   Wt 254 lb 12.8 oz (115.6 kg)   SpO2 98%   BMI 34.55 kg/m    Subjective:    Patient ID: Daniel Martinez, male    DOB: 12-29-1990, 33 y.o.   MRN: [id number]  HPI: Daniel Martinez is a 33 y.o. male  Chief Complaint  Patient presents with   Gynecomastia    Wondering about gynecomastia and labs   Started noticing it a couple of weeks ago. Has been having pain in his chest. He feels pain in his breast tissue for longer than a couple of weeks  BREAST PAIN Duration :weeks Location: left Onset: gradual Severity: mild Quality: aching Frequency: intermittent Redness: no Swelling: no Trauma: no trauma Breastfeeding: N/A Nipple discharge: no Breast lump: no Status: stable Treatments attempted: none Previous mammogram: no   Relevant past medical, surgical, family and social history reviewed and updated as indicated. Interim medical history since our last visit reviewed. Allergies and medications reviewed and updated.  Review of Systems  Constitutional: Negative.   Respiratory: Negative.    Cardiovascular: Negative.   Musculoskeletal: Negative.   Neurological: Negative.   Psychiatric/Behavioral: Negative.      Per HPI unless specifically indicated above     Objective:    BP 113/68 (BP Location: Left Arm, Patient Position: Sitting, Cuff Size: Large)   Pulse 79   Temp 98.3 F (36.8 C) (Oral)   Resp 15   Ht 6' 0.01" (1.829 m)   Wt 254 lb 12.8 oz (115.6 kg)   SpO2 98%   BMI 34.55 kg/m   Wt Readings from Last 3 Encounters:  12/22/23 254 lb 12.8 oz (115.6 kg)  10/22/23 260 lb 6.4 oz (118.1 kg)  08/17/23 257 lb 9.6 oz (116.8 kg)    Physical Exam Vitals and nursing note reviewed.  Constitutional:      General: He is not in acute distress.    Appearance: Normal appearance. He is not ill-appearing,  toxic-appearing or diaphoretic.  HENT:     Head: Normocephalic and atraumatic.     Right Ear: External ear normal.     Left Ear: External ear normal.     Nose: Nose normal.     Mouth/Throat:     Mouth: Mucous membranes are moist.     Pharynx: Oropharynx is clear.  Eyes:     General: No scleral icterus.       Right eye: No discharge.        Left eye: No discharge.     Extraocular Movements: Extraocular movements intact.     Conjunctiva/sclera: Conjunctivae normal.     Pupils: Pupils are equal, round, and reactive to light.  Cardiovascular:     Rate and Rhythm: Normal rate and regular rhythm.     Pulses: Normal pulses.     Heart sounds: Normal heart sounds. No murmur heard.    No friction rub. No gallop.  Pulmonary:     Effort: Pulmonary effort is normal. No respiratory distress.     Breath sounds: Normal breath sounds. No stridor. No wheezing, rhonchi or rales.  Chest:     Chest wall: No tenderness.  Musculoskeletal:        General: Normal range of motion.     Cervical back: Normal range of motion and neck supple.  Comments: Increased fat distribution over chest. No true gynecomastia.  Skin:    General: Skin is warm and dry.     Capillary Refill: Capillary refill takes less than 2 seconds.     Coloration: Skin is not jaundiced or pale.     Findings: No bruising, erythema, lesion or rash.  Neurological:     General: No focal deficit present.     Mental Status: He is alert and oriented to person, place, and time. Mental status is at baseline.  Psychiatric:        Mood and Affect: Mood normal.        Behavior: Behavior normal.        Thought Content: Thought content normal.        Judgment: Judgment normal.     Results for orders placed or performed in visit on 10/22/23  CBC with Differential/Platelet   Collection Time: 10/22/23  1:29 PM  Result Value Ref Range   WBC 6.6 3.4 - 10.8 x10E3/uL   RBC 5.19 4.14 - 5.80 x10E6/uL   Hemoglobin 15.5 13.0 - 17.7 g/dL    Hematocrit 16.1 09.6 - 51.0 %   MCV 86 79 - 97 fL   MCH 29.9 26.6 - 33.0 pg   MCHC 34.9 31.5 - 35.7 g/dL   RDW 04.5 40.9 - 81.1 %   Platelets 337 150 - 450 x10E3/uL   Neutrophils 59 Not Estab. %   Lymphs 24 Not Estab. %   Monocytes 10 Not Estab. %   Eos 5 Not Estab. %   Basos 1 Not Estab. %   Neutrophils Absolute 3.9 1.4 - 7.0 x10E3/uL   Lymphocytes Absolute 1.6 0.7 - 3.1 x10E3/uL   Monocytes Absolute 0.7 0.1 - 0.9 x10E3/uL   EOS (ABSOLUTE) 0.4 0.0 - 0.4 x10E3/uL   Basophils Absolute 0.1 0.0 - 0.2 x10E3/uL   Immature Granulocytes 1 Not Estab. %   Immature Grans (Abs) 0.0 0.0 - 0.1 x10E3/uL  Basic metabolic panel   Collection Time: 10/22/23  1:29 PM  Result Value Ref Range   Glucose 98 70 - 99 mg/dL   BUN 13 6 - 20 mg/dL   Creatinine, Ser 9.14 0.76 - 1.27 mg/dL   eGFR 782 >95 AO/ZHY/8.65   BUN/Creatinine Ratio 14 9 - 20   Sodium 135 134 - 144 mmol/L   Potassium 4.0 3.5 - 5.2 mmol/L   Chloride 94 (L) 96 - 106 mmol/L   CO2 24 20 - 29 mmol/L   Calcium 9.7 8.7 - 10.2 mg/dL  VITAMIN D 25 Hydroxy (Vit-D Deficiency, Fractures)   Collection Time: 10/22/23  1:29 PM  Result Value Ref Range   Vit D, 25-Hydroxy 47.7 30.0 - 100.0 ng/mL      Assessment & Plan:   Problem List Items Addressed This Visit   None Visit Diagnoses       Pseudogynecomastia    -  Primary   Discussed diet and exercise- will return for blood work. Call with any concerns.   Relevant Orders   Prolactin   Testosterone, free, total([facility])   LH   Estradiol   hCG, serum, qualitative   Thyroid Panel With TSH        Follow up plan: Return for As scheduled.

## 2023-12-22 NOTE — Patient Instructions (Signed)
 Wegovy, zepbound- se if they're covered

## 2023-12-25 ENCOUNTER — Other Ambulatory Visit

## 2023-12-25 DIAGNOSIS — N62 Hypertrophy of breast: Secondary | ICD-10-CM | POA: Diagnosis not present

## 2023-12-31 ENCOUNTER — Encounter: Payer: Self-pay | Admitting: Family Medicine

## 2023-12-31 ENCOUNTER — Other Ambulatory Visit: Payer: Self-pay | Admitting: Family Medicine

## 2023-12-31 DIAGNOSIS — E230 Hypopituitarism: Secondary | ICD-10-CM

## 2023-12-31 DIAGNOSIS — R899 Unspecified abnormal finding in specimens from other organs, systems and tissues: Secondary | ICD-10-CM

## 2023-12-31 DIAGNOSIS — R7989 Other specified abnormal findings of blood chemistry: Secondary | ICD-10-CM

## 2023-12-31 LAB — PROLACTIN: Prolactin: 25.6 ng/mL — ABNORMAL HIGH (ref 3.9–22.7)

## 2023-12-31 LAB — LUTEINIZING HORMONE: LH: 11.6 m[IU]/mL — ABNORMAL HIGH (ref 1.7–8.6)

## 2023-12-31 LAB — TESTOSTERONE, FREE, TOTAL, SHBG
Sex Hormone Binding: 23.6 nmol/L (ref 16.5–55.9)
Testosterone, Free: 15.6 pg/mL (ref 8.7–25.1)
Testosterone: 386 ng/dL (ref 264–916)

## 2023-12-31 LAB — THYROID PANEL WITH TSH
Free Thyroxine Index: 2.3 (ref 1.2–4.9)
T3 Uptake Ratio: 26 % (ref 24–39)
T4, Total: 9 ug/dL (ref 4.5–12.0)
TSH: 1.01 u[IU]/mL (ref 0.450–4.500)

## 2023-12-31 LAB — HCG, SERUM, QUALITATIVE: hCG,Beta Subunit,Qual,Serum: NEGATIVE m[IU]/mL

## 2023-12-31 LAB — ESTRADIOL: Estradiol: 24.2 pg/mL (ref 7.6–42.6)

## 2024-01-05 DIAGNOSIS — G5603 Carpal tunnel syndrome, bilateral upper limbs: Secondary | ICD-10-CM | POA: Diagnosis not present

## 2024-01-12 ENCOUNTER — Encounter: Payer: Self-pay | Admitting: Family Medicine

## 2024-01-25 ENCOUNTER — Encounter: Payer: Self-pay | Admitting: Family Medicine

## 2024-02-19 DIAGNOSIS — G5601 Carpal tunnel syndrome, right upper limb: Secondary | ICD-10-CM | POA: Diagnosis not present

## 2024-03-11 DIAGNOSIS — G5602 Carpal tunnel syndrome, left upper limb: Secondary | ICD-10-CM | POA: Diagnosis not present

## 2024-04-25 DIAGNOSIS — Z0189 Encounter for other specified special examinations: Secondary | ICD-10-CM | POA: Diagnosis not present

## 2024-04-28 ENCOUNTER — Other Ambulatory Visit: Payer: Self-pay

## 2024-04-28 ENCOUNTER — Ambulatory Visit (INDEPENDENT_AMBULATORY_CARE_PROVIDER_SITE_OTHER): Payer: Self-pay | Admitting: Family Medicine

## 2024-04-28 ENCOUNTER — Encounter: Payer: Self-pay | Admitting: Family Medicine

## 2024-04-28 ENCOUNTER — Ambulatory Visit: Payer: Self-pay | Admitting: Family Medicine

## 2024-04-28 VITALS — BP 132/79 | HR 71 | Temp 97.7°F | Ht 72.0 in | Wt 262.4 lb

## 2024-04-28 DIAGNOSIS — R7989 Other specified abnormal findings of blood chemistry: Secondary | ICD-10-CM | POA: Insufficient documentation

## 2024-04-28 DIAGNOSIS — Z23 Encounter for immunization: Secondary | ICD-10-CM

## 2024-04-28 DIAGNOSIS — L409 Psoriasis, unspecified: Secondary | ICD-10-CM | POA: Insufficient documentation

## 2024-04-28 DIAGNOSIS — I10 Essential (primary) hypertension: Secondary | ICD-10-CM

## 2024-04-28 DIAGNOSIS — Z0000 Encounter for general adult medical examination without abnormal findings: Secondary | ICD-10-CM

## 2024-04-28 DIAGNOSIS — H6123 Impacted cerumen, bilateral: Secondary | ICD-10-CM | POA: Diagnosis not present

## 2024-04-28 DIAGNOSIS — E559 Vitamin D deficiency, unspecified: Secondary | ICD-10-CM | POA: Diagnosis not present

## 2024-04-28 DIAGNOSIS — E66811 Obesity, class 1: Secondary | ICD-10-CM | POA: Insufficient documentation

## 2024-04-28 DIAGNOSIS — F422 Mixed obsessional thoughts and acts: Secondary | ICD-10-CM

## 2024-04-28 LAB — MICROALBUMIN, URINE WAIVED
Creatinine, Urine Waived: 200 mg/dL (ref 10–300)
Microalb, Ur Waived: 80 mg/L — ABNORMAL HIGH (ref 0–19)

## 2024-04-28 LAB — BAYER DCA HB A1C WAIVED: HB A1C (BAYER DCA - WAIVED): 5.4 % (ref 4.8–5.6)

## 2024-04-28 MED ORDER — LISINOPRIL-HYDROCHLOROTHIAZIDE 20-25 MG PO TABS
1.0000 | ORAL_TABLET | Freq: Every day | ORAL | 1 refills | Status: DC
  Filled 2024-04-28 – 2024-07-03 (×2): qty 90, 90d supply, fill #0

## 2024-04-28 MED ORDER — TRIAMCINOLONE ACETONIDE 0.5 % EX OINT
1.0000 | TOPICAL_OINTMENT | Freq: Two times a day (BID) | CUTANEOUS | 1 refills | Status: AC
  Filled 2024-04-28: qty 90, 45d supply, fill #0

## 2024-04-28 NOTE — Assessment & Plan Note (Signed)
 Doing well not on medicine. Continue to monitor. Call with any concerns. Continue to monitor.

## 2024-04-28 NOTE — Assessment & Plan Note (Signed)
 Under good control on current regimen. Continue current regimen. Continue to monitor. Call with any concerns. Refills given. Labs drawn today.

## 2024-04-28 NOTE — Assessment & Plan Note (Signed)
Will treat with triamcinalone. Call if not getting better or getting worse. 

## 2024-04-28 NOTE — Assessment & Plan Note (Signed)
 Due to hypertension and GERD. Would benefit from wegovy or zepbound- will check with his insurance and if covered, will start.

## 2024-04-28 NOTE — Patient Instructions (Signed)
 Wegovy, zepbound

## 2024-04-28 NOTE — Progress Notes (Signed)
 BP 132/79   Pulse 71   Temp 97.7 F (36.5 C) (Oral)   Ht 6' (1.829 m)   Wt 262 lb 6.4 oz (119 kg)   SpO2 96%   BMI 35.59 kg/m    Subjective:    Patient ID: Daniel Martinez, male    DOB: 10/18/1990, 33 y.o.   MRN: [id number]  HPI: Daniel Martinez is a 33 y.o. male presenting on 04/28/2024 for comprehensive medical examination. Current medical complaints include:  HYPERTENSION  Hypertension status: controlled  Satisfied with current treatment? yes Duration of hypertension: chronic BP monitoring frequency:  rarely BP medication side effects:  no Medication compliance: excellent compliance Previous BP meds:lisinopril -hctz Aspirin: no Recurrent headaches: no Visual changes: no Palpitations: no Dyspnea: no Chest pain: no Lower extremity edema: no Dizzy/lightheaded: no  ANXIETY/STRESS- has actually been doing really well and has not been taking his medicine. Feels stable Duration: chronic Status:better Anxious mood: no  Excessive worrying: no Irritability: no  Sweating: no Nausea: no Palpitations:no Hyperventilation: no Panic attacks: no Agoraphobia: no  Obscessions/compulsions: no Depressed mood: no    04/28/2024    8:20 AM 12/22/2023    2:52 PM 10/22/2023    1:18 PM 08/17/2023    9:30 AM 07/21/2023    9:07 AM  Depression screen PHQ 2/9  Decreased Interest 1 1 1 1 1   Down, Depressed, Hopeless 1 1 1 1 2   PHQ - 2 Score 2 2 2 2 3   Altered sleeping 1 1 1 1 2   Tired, decreased energy 1 1 1 1 3   Change in appetite 1 1 2 1 1   Feeling bad or failure about yourself  1 1 1 1 1   Trouble concentrating 1 1 1 1 2   Moving slowly or fidgety/restless 1 1 1 1 2   Suicidal thoughts 0 0 1 0 0  PHQ-9 Score 8 8 10 8 14   Difficult doing work/chores Not difficult at all Somewhat difficult Not difficult at all Somewhat difficult Somewhat difficult   Anhedonia: no Weight changes: no Insomnia: no   Hypersomnia: no Fatigue/loss of energy: no Feelings of worthlessness:  no Feelings of guilt: no Impaired concentration/indecisiveness: no Suicidal ideations: no  Crying spells: no Recent Stressors/Life Changes: no   Relationship problems: no   Family stress: no     Financial stress: no    Job stress: no    Recent death/loss: no  OBESITY Duration: chronic Previous attempts at weight loss: yes- portion control, exercise, weight watchers Complications of obesity: HTN, GERD Peak weight: 262 (current) Weight loss goal: 200# Weight loss to date: none Requesting obesity pharmacotherapy: yes Current weight loss supplements/medications: no Previous weight loss supplements/meds: no  Clinical coverage for weight loss GLP's   Medication being dispensed is Wegovy 3 mL/28 day. Titration doses are 2 mL/28 days.   [x]  Product being prescribed is FDA approved for the indication, age, weight (if applicable) and not does not exceed dosing limits per the Prescribing Information per the clinical conditions for use.  [x]  Patient's baseline weight measured within the last 45 days as required by provider before dispensing.  [x]  Patient is new to therapy and One of the following:   [x]  The beneficiary is 33 years of age or over and has ONE of the following:  [x]  A BMI greater than or equal to 30 kg/m2  [x]  A BMI greater than or equal to 27 kg/m2 with at least one weight-related comorbidity/risk factor/complication (i.e. hypertension, type 2 diabetes, obstructive sleep apnea, cardiovascular  disease, dyslipidemia)  [x]  If patient has one weight-related comorbidity/risk factor/complication (i.e. hypertension, type 2 diabetes, obstructive sleep apnea, cardiovascular disease, dyslipidemia), please list  Patient suffers from weight-related comorbidity/risk factor/complication HTN, GERD  []  The beneficiary is 34 years of age or older with a BMI greater than or equal to 27 kg/m2 AND has established cardiovascular disease (CVD) defined as having a history of myocardial  infarction, stroke, or symptomatic peripheral disease, to be documented on the PA form. AND  [x]  The beneficiary is currently on and will continue lifestyle modification including structured nutrition and physical activity, unless physical activity is not clinically appropriate at the time GLP1 therapy commences AND  [x]  The beneficiary will NOT be using the requested agent in combination with another GLP-1 receptor agonist agent AND  [x]  The beneficiary does NOT have any FDA-labeled contraindications to the requested agent, including pregnancy, lactation, history of medullary thyroid  cancer or multiple endocrine neoplasia type II.   Last BMI/Weight/Height recorded Estimated body mass index is 35.59 kg/m as calculated from the following:   Height as of this encounter: 6' (1.829 m).   Weight as of this encounter: 262 lb 6.4 oz (119 kg).     He currently lives with: Interim Problems from his last visit: no  Depression Screen done today and results listed below:     04/28/2024    8:20 AM 12/22/2023    2:52 PM 10/22/2023    1:18 PM 08/17/2023    9:30 AM 07/21/2023    9:07 AM  Depression screen PHQ 2/9  Decreased Interest 1 1 1 1 1   Down, Depressed, Hopeless 1 1 1 1 2   PHQ - 2 Score 2 2 2 2 3   Altered sleeping 1 1 1 1 2   Tired, decreased energy 1 1 1 1 3   Change in appetite 1 1 2 1 1   Feeling bad or failure about yourself  1 1 1 1 1   Trouble concentrating 1 1 1 1 2   Moving slowly or fidgety/restless 1 1 1 1 2   Suicidal thoughts 0 0 1 0 0  PHQ-9 Score 8 8 10 8 14   Difficult doing work/chores Not difficult at all Somewhat difficult Not difficult at all Somewhat difficult Somewhat difficult    Past Medical History:  Past Medical History:  Diagnosis Date   Hypertension     Surgical History:  Past Surgical History:  Procedure Laterality Date   CARPAL TUNNEL RELEASE Bilateral    May and June 2025   FLEXIBLE SIGMOIDOSCOPY N/A 01/26/2023   Procedure: FLEXIBLE SIGMOIDOSCOPY;   Surgeon: Unk Corinn Skiff, MD;  Location[facility] ENDOSCOPY;  Service: Gastroenterology;  Laterality: N/A;   WISDOM TOOTH EXTRACTION Bilateral 2021    Medications:  Current Outpatient Medications on File Prior to Visit  Medication Sig   ibuprofen (ADVIL) 600 MG tablet Take 600 mg by mouth 3 (three) times daily. (Patient taking differently: Take 600 mg by mouth every 6 (six) hours as needed for headache, mild pain (pain score 1-3) or moderate pain (pain score 4-6).)   Multiple Vitamin (MULTIVITAMIN ADULT PO) Take 1 tablet by mouth daily.   No current facility-administered medications on file prior to visit.    Allergies:  No Known Allergies  Social History:  Social History   Socioeconomic History   Marital status: Single    Spouse name: Not on file   Number of children: Not on file   Years of education: Not on file   Highest education level: GED or equivalent  Occupational History   Not on file  Tobacco Use   Smoking status: Never   Smokeless tobacco: Never  Vaping Use   Vaping status: Never Used  Substance and Sexual Activity   Alcohol use: Yes    Comment: rare   Drug use: No   Sexual activity: Not Currently  Other Topics Concern   Not on file  Social History Narrative   Not on file   Social Drivers of Health   Financial Resource Strain: Medium Risk (12/22/2023)   Overall Financial Resource Strain (CARDIA)    Difficulty of Paying Living Expenses: Somewhat hard  Food Insecurity: No Food Insecurity (12/22/2023)   Hunger Vital Sign    Worried About Running Out of Food in the Last Year: Never true    Ran Out of Food in the Last Year: Never true  Transportation Needs: No Transportation Needs (12/22/2023)   PRAPARE - Administrator, Civil Service (Medical): No    Lack of Transportation (Non-Medical): No  Physical Activity: Unknown (12/22/2023)   Exercise Vital Sign    Days of Exercise per Week: Patient declined    Minutes of Exercise per Session: Not on file   Stress: Stress Concern Present (12/22/2023)   [employer] of Occupational Health - Occupational Stress Questionnaire    Feeling of Stress : To some extent  Social Connections: Socially Isolated (12/22/2023)   Social Connection and Isolation Panel    Frequency of Communication with Friends and Family: More than three times a week    Frequency of Social Gatherings with Friends and Family: Twice a week    Attends Religious Services: Never    Diplomatic Services operational officer: No    Attends Engineer, structural: Not on file    Marital Status: Never married  Catering manager Violence: Not on file   Social History   Tobacco Use  Smoking Status Never  Smokeless Tobacco Never   Social History   Substance and Sexual Activity  Alcohol Use Yes   Comment: rare    Family History:  Family History  Problem Relation Age of Onset   Hypertension Mother    Diabetes Mother    Hyperlipidemia Father    Cirrhosis Maternal Uncle     Past medical history, surgical history, medications, allergies, family history and social history reviewed with patient today and changes made to appropriate areas of the chart.   Review of Systems  Constitutional: Negative.   HENT:  Positive for ear pain. Negative for congestion, ear discharge, hearing loss, nosebleeds, sinus pain, sore throat and tinnitus.   Eyes: Negative.   Respiratory:  Positive for shortness of breath. Negative for cough, hemoptysis, sputum production, wheezing and stridor.   Cardiovascular: Negative.   Gastrointestinal:  Positive for diarrhea and heartburn. Negative for abdominal pain, blood in stool, constipation, melena, nausea and vomiting.  Genitourinary: Negative.   Musculoskeletal:  Positive for myalgias. Negative for back pain, falls, joint pain and neck pain.  Skin:  Positive for rash.  Neurological:  Positive for tingling. Negative for dizziness, tremors, sensory change, speech change, focal weakness, seizures,  loss of consciousness, weakness and headaches.  Endo/Heme/Allergies:  Positive for polydipsia. Negative for environmental allergies. Does not bruise/bleed easily.  Psychiatric/Behavioral: Negative.     All other ROS negative except what is listed above and in the HPI.      Objective:    BP 132/79   Pulse 71   Temp 97.7 F (36.5 C) (Oral)   Ht  6' (1.829 m)   Wt 262 lb 6.4 oz (119 kg)   SpO2 96%   BMI 35.59 kg/m   Wt Readings from Last 3 Encounters:  04/28/24 262 lb 6.4 oz (119 kg)  12/22/23 254 lb 12.8 oz (115.6 kg)  10/22/23 260 lb 6.4 oz (118.1 kg)    Physical Exam Vitals and nursing note reviewed.  Constitutional:      General: He is not in acute distress.    Appearance: Normal appearance. He is obese. He is not ill-appearing, toxic-appearing or diaphoretic.  HENT:     Head: Normocephalic and atraumatic.     Right Ear: Ear canal and external ear normal. There is impacted cerumen.     Left Ear: Ear canal and external ear normal. There is impacted cerumen.     Nose: Nose normal. No congestion or rhinorrhea.     Mouth/Throat:     Mouth: Mucous membranes are moist.     Pharynx: Oropharynx is clear. No oropharyngeal exudate or posterior oropharyngeal erythema.  Eyes:     General: No scleral icterus.       Right eye: No discharge.        Left eye: No discharge.     Extraocular Movements: Extraocular movements intact.     Conjunctiva/sclera: Conjunctivae normal.     Pupils: Pupils are equal, round, and reactive to light.  Neck:     Vascular: No carotid bruit.  Cardiovascular:     Rate and Rhythm: Normal rate and regular rhythm.     Pulses: Normal pulses.     Heart sounds: No murmur heard.    No friction rub. No gallop.  Pulmonary:     Effort: Pulmonary effort is normal. No respiratory distress.     Breath sounds: Normal breath sounds. No stridor. No wheezing, rhonchi or rales.  Chest:     Chest wall: No tenderness.  Abdominal:     General: Abdomen is flat. Bowel  sounds are normal. There is no distension.     Palpations: Abdomen is soft. There is no mass.     Tenderness: There is no abdominal tenderness. There is no right CVA tenderness, left CVA tenderness, guarding or rebound.     Hernia: No hernia is present.  Genitourinary:    Comments: Genital exam deferred with shared decision making Musculoskeletal:        General: No swelling, tenderness, deformity or signs of injury.     Cervical back: Normal range of motion and neck supple. No rigidity. No muscular tenderness.     Right lower leg: No edema.     Left lower leg: No edema.  Lymphadenopathy:     Cervical: No cervical adenopathy.  Skin:    General: Skin is warm and dry.     Capillary Refill: Capillary refill takes less than 2 seconds.     Coloration: Skin is not jaundiced or pale.     Findings: Rash present. No bruising, erythema or lesion.     Comments: Psoriatic plaques on arms and legs  Neurological:     General: No focal deficit present.     Mental Status: He is alert and oriented to person, place, and time.     Cranial Nerves: No cranial nerve deficit.     Sensory: No sensory deficit.     Motor: No weakness.     Coordination: Coordination normal.     Gait: Gait normal.     Deep Tendon Reflexes: Reflexes normal.  Psychiatric:  Mood and Affect: Mood normal.        Behavior: Behavior normal.        Thought Content: Thought content normal.        Judgment: Judgment normal.     Results for orders placed or performed in visit on 12/25/23  Thyroid  Panel With TSH   Collection Time: 12/25/23  8:23 AM  Result Value Ref Range   TSH 1.010 0.450 - 4.500 uIU/mL   T4, Total 9.0 4.5 - 12.0 ug/dL   T3 Uptake Ratio 26 24 - 39 %   Free Thyroxine Index 2.3 1.2 - 4.9  hCG, serum, qualitative   Collection Time: 12/25/23  8:23 AM  Result Value Ref Range   hCG,Beta Subunit,Qual,Serum Negative mIU/mL  Estradiol    Collection Time: 12/25/23  8:23 AM  Result Value Ref Range   Estradiol   24.2 7.6 - 42.6 pg/mL  LH   Collection Time: 12/25/23  8:23 AM  Result Value Ref Range   LH 11.6 (H) 1.7 - 8.6 mIU/mL  Testosterone , free, total([facility])   Collection Time: 12/25/23  8:23 AM  Result Value Ref Range   Testosterone  386 264 - 916 ng/dL   Testosterone , Free 15.6 8.7 - 25.1 pg/mL   Sex Hormone Binding 23.6 16.5 - 55.9 nmol/L  Prolactin   Collection Time: 12/25/23  8:23 AM  Result Value Ref Range   Prolactin 25.6 (H) 3.9 - 22.7 ng/mL      Assessment & Plan:   Problem List Items Addressed This Visit       Cardiovascular and Mediastinum   Essential hypertension   Under good control on current regimen. Continue current regimen. Continue to monitor. Call with any concerns. Refills given. Labs drawn today.        Relevant Medications   lisinopril -hydrochlorothiazide  (ZESTORETIC ) 20-25 MG tablet   Other Relevant Orders   Microalbumin, Urine Waived     Musculoskeletal and Integument   Psoriasis   Will treat with triamcinalone. Call if not getting better or getting worse.         Other   OCD (obsessive compulsive disorder)   Doing well not on medicine. Continue to monitor. Call with any concerns. Continue to monitor.      Vitamin D  deficiency   Rechecking labs today. Await results. Treat as needed.       Relevant Orders   VITAMIN D  25 Hydroxy (Vit-D Deficiency, Fractures)   Elevated prolactin level   Rechecking labs today. Await results. Treat as needed.       Relevant Orders   Prolactin   Morbid obesity (HCC)   Due to hypertension and GERD. Would benefit from wegovy or zepbound- will check with his insurance and if covered, will start.      Other Visit Diagnoses       Routine general medical examination at a health care facility    -  Primary   Vaccines up to date. Screening labs checked today. Continue diet and exercise. Call with any concerns.   Relevant Orders   Comprehensive metabolic panel with GFR   CBC with Differential/Platelet    Lipid Panel w/o Chol/HDL Ratio   TSH   Bayer DCA Hb A1c Waived     Bilateral impacted cerumen       Ears flushed today with fair results.        LABORATORY TESTING:  Health maintenance labs ordered today as discussed above.   IMMUNIZATIONS:   - Tdap: Tetanus vaccination status reviewed: last tetanus booster within  10 years. - Influenza: Postponed to flu season - Pneumovax: Not applicable - Prevnar: Not applicable - COVID: Up to date - HPV: Administered today  PATIENT COUNSELING:    Sexuality: Discussed sexually transmitted diseases, partner selection, use of condoms, avoidance of unintended pregnancy  and contraceptive alternatives.   Advised to avoid cigarette smoking.  I discussed with the patient that most people either abstain from alcohol or drink within safe limits (<=14/week and <=4 drinks/occasion for males, <=7/weeks and <= 3 drinks/occasion for females) and that the risk for alcohol disorders and other health effects rises proportionally with the number of drinks per week and how often a drinker exceeds daily limits.  Discussed cessation/primary prevention of drug use and availability of treatment for abuse.   Diet: Encouraged to adjust caloric intake to maintain  or achieve ideal body weight, to reduce intake of dietary saturated fat and total fat, to limit sodium intake by avoiding high sodium foods and not adding table salt, and to maintain adequate dietary potassium and calcium preferably from fresh fruits, vegetables, and low-fat dairy products.    stressed the importance of regular exercise  Injury prevention: Discussed safety belts, safety helmets, smoke detector, smoking near bedding or upholstery.   Dental health: Discussed importance of regular tooth brushing, flossing, and dental visits.   Follow up plan: NEXT PREVENTATIVE PHYSICAL DUE IN 1 YEAR. Return for 1 month nurse only visit for HPV #2, 6 months with me.

## 2024-04-28 NOTE — Assessment & Plan Note (Signed)
 Rechecking labs today. Await results. Treat as needed.

## 2024-04-29 ENCOUNTER — Other Ambulatory Visit: Payer: Self-pay

## 2024-04-29 ENCOUNTER — Encounter: Payer: Self-pay | Admitting: Family Medicine

## 2024-04-29 ENCOUNTER — Other Ambulatory Visit (HOSPITAL_COMMUNITY): Payer: Self-pay

## 2024-04-29 LAB — LIPID PANEL W/O CHOL/HDL RATIO
Cholesterol, Total: 171 mg/dL (ref 100–199)
HDL: 50 mg/dL (ref >39)
LDL Chol Calc (NIH): 94 mg/dL (ref 0–99)
Triglycerides: 153 mg/dL — ABNORMAL HIGH (ref 0–149)
VLDL Cholesterol Cal: 27 mg/dL (ref 5–40)

## 2024-04-29 LAB — COMPREHENSIVE METABOLIC PANEL WITH GFR
ALT: 33 IU/L (ref 0–44)
AST: 22 IU/L (ref 0–40)
Albumin: 4.5 g/dL (ref 4.1–5.1)
Alkaline Phosphatase: 82 IU/L (ref 44–121)
BUN/Creatinine Ratio: 12 (ref 9–20)
BUN: 11 mg/dL (ref 6–20)
Bilirubin Total: 0.5 mg/dL (ref 0.0–1.2)
CO2: 25 mmol/L (ref 20–29)
Calcium: 9.5 mg/dL (ref 8.7–10.2)
Chloride: 97 mmol/L (ref 96–106)
Creatinine, Ser: 0.91 mg/dL (ref 0.76–1.27)
Globulin, Total: 2.8 g/dL (ref 1.5–4.5)
Glucose: 97 mg/dL (ref 70–99)
Potassium: 3.9 mmol/L (ref 3.5–5.2)
Sodium: 138 mmol/L (ref 134–144)
Total Protein: 7.3 g/dL (ref 6.0–8.5)
eGFR: 115 mL/min/1.73 (ref >59)

## 2024-04-29 LAB — CBC WITH DIFFERENTIAL/PLATELET
Basophils Absolute: 0.1 x10E3/uL (ref 0.0–0.2)
Basos: 1 %
EOS (ABSOLUTE): 0.4 x10E3/uL (ref 0.0–0.4)
Eos: 7 %
Hematocrit: 45.3 % (ref 37.5–51.0)
Hemoglobin: 15.3 g/dL (ref 13.0–17.7)
Immature Grans (Abs): 0 x10E3/uL (ref 0.0–0.1)
Immature Granulocytes: 0 %
Lymphocytes Absolute: 1.9 x10E3/uL (ref 0.7–3.1)
Lymphs: 29 %
MCH: 29.4 pg (ref 26.6–33.0)
MCHC: 33.8 g/dL (ref 31.5–35.7)
MCV: 87 fL (ref 79–97)
Monocytes Absolute: 0.6 x10E3/uL (ref 0.1–0.9)
Monocytes: 10 %
Neutrophils Absolute: 3.5 x10E3/uL (ref 1.4–7.0)
Neutrophils: 52 %
Platelets: 328 x10E3/uL (ref 150–450)
RBC: 5.2 x10E6/uL (ref 4.14–5.80)
RDW: 13.2 % (ref 11.6–15.4)
WBC: 6.5 x10E3/uL (ref 3.4–10.8)

## 2024-04-29 LAB — TSH: TSH: 0.884 u[IU]/mL (ref 0.450–4.500)

## 2024-04-29 LAB — VITAMIN D 25 HYDROXY (VIT D DEFICIENCY, FRACTURES): Vit D, 25-Hydroxy: 31 ng/mL (ref 30.0–100.0)

## 2024-04-29 LAB — PROLACTIN: Prolactin: 24.4 ng/mL — ABNORMAL HIGH (ref 3.9–22.7)

## 2024-04-29 MED ORDER — WEGOVY 0.5 MG/0.5ML ~~LOC~~ SOAJ
0.5000 mg | SUBCUTANEOUS | 1 refills | Status: DC
  Filled 2024-04-29 – 2024-08-09 (×3): qty 2, 28d supply, fill #0
  Filled 2024-08-31: qty 2, 28d supply, fill #1

## 2024-04-29 MED ORDER — WEGOVY 0.25 MG/0.5ML ~~LOC~~ SOAJ
0.2500 mg | SUBCUTANEOUS | 0 refills | Status: DC
  Filled 2024-04-29 – 2024-07-20 (×9): qty 2, 28d supply, fill #0

## 2024-05-02 ENCOUNTER — Other Ambulatory Visit: Payer: Self-pay

## 2024-05-05 ENCOUNTER — Other Ambulatory Visit: Payer: Self-pay

## 2024-05-06 ENCOUNTER — Other Ambulatory Visit: Payer: Self-pay

## 2024-05-09 ENCOUNTER — Other Ambulatory Visit: Payer: Self-pay

## 2024-05-20 ENCOUNTER — Other Ambulatory Visit: Payer: Self-pay

## 2024-06-01 ENCOUNTER — Ambulatory Visit (INDEPENDENT_AMBULATORY_CARE_PROVIDER_SITE_OTHER)

## 2024-06-01 DIAGNOSIS — Z23 Encounter for immunization: Secondary | ICD-10-CM | POA: Diagnosis not present

## 2024-06-01 NOTE — Progress Notes (Signed)
 Patient is in office today for a nurse visit for Immunization. Patient Injection was given in the  Right deltoid. Patient tolerated injection well.

## 2024-06-14 DIAGNOSIS — Z23 Encounter for immunization: Secondary | ICD-10-CM | POA: Diagnosis not present

## 2024-06-16 ENCOUNTER — Other Ambulatory Visit: Payer: Self-pay | Admitting: Family Medicine

## 2024-06-16 NOTE — Telephone Encounter (Signed)
 Requested Prescriptions  Refused Prescriptions Disp Refills   lisinopril -hydrochlorothiazide  (ZESTORETIC ) 20-25 MG tablet [Pharmacy Med Name: LISINOPRIL -HCTZ 20/25MG  TABLETS] 90 tablet 1    Sig: TAKE 1 TABLET BY MOUTH DAILY     Cardiovascular:  ACEI + Diuretic Combos Passed - 06/16/2024  5:30 PM      Passed - Na in normal range and within 180 days    Sodium  Date Value Ref Range Status  04/28/2024 138 134 - 144 mmol/L Final         Passed - K in normal range and within 180 days    Potassium  Date Value Ref Range Status  04/28/2024 3.9 3.5 - 5.2 mmol/L Final         Passed - Cr in normal range and within 180 days    Creatinine, Ser  Date Value Ref Range Status  04/28/2024 0.91 0.76 - 1.27 mg/dL Final         Passed - eGFR is 30 or above and within 180 days    GFR calc Af Amer  Date Value Ref Range Status  07/17/2020 118 >59 mL/min/1.73 Final    Comment:    **[facility] currently reports eGFR in compliance with the current**   recommendations of the [employer]. [facility] will   update reporting as new guidelines are published from the NKF-ASN   Task force.    GFR calc non Af Amer  Date Value Ref Range Status  07/17/2020 102 >59 mL/min/1.73 Final   eGFR  Date Value Ref Range Status  04/28/2024 115 >59 mL/min/1.73 Final         Passed - Patient is not pregnant      Passed - Last BP in normal range    BP Readings from Last 1 Encounters:  04/28/24 132/79         Passed - Valid encounter within last 6 months    Recent Outpatient Visits           1 month ago Routine general medical examination at a health care facility   [facility] [place], Megan P, DO   5 months ago Pseudogynecomastia   [facility] [place], [place], [place]

## 2024-07-03 ENCOUNTER — Other Ambulatory Visit: Payer: Self-pay

## 2024-07-04 ENCOUNTER — Telehealth: Payer: Self-pay

## 2024-07-04 NOTE — Telephone Encounter (Signed)
 Copied from CRM [id number]. Topic: Clinical - Medication Prior Auth >> Jul 04, 2024  4:12 PM Winona R wrote: Pt calling to check the status of Wegovy  as the pharmacy states they never received communication on the outcome. Please contact the pt with any updates.

## 2024-07-05 ENCOUNTER — Other Ambulatory Visit (HOSPITAL_COMMUNITY): Payer: Self-pay

## 2024-07-05 NOTE — Telephone Encounter (Signed)
 ERROR

## 2024-07-06 ENCOUNTER — Other Ambulatory Visit: Payer: Self-pay

## 2024-07-06 ENCOUNTER — Other Ambulatory Visit (HOSPITAL_COMMUNITY): Payer: Self-pay

## 2024-07-07 ENCOUNTER — Other Ambulatory Visit (HOSPITAL_COMMUNITY): Payer: Self-pay

## 2024-07-13 ENCOUNTER — Other Ambulatory Visit (HOSPITAL_COMMUNITY): Payer: Self-pay

## 2024-07-13 ENCOUNTER — Telehealth: Payer: Self-pay | Admitting: Family Medicine

## 2024-07-13 NOTE — Telephone Encounter (Signed)
 Copied from CRM #[id number]. Topic: Clinical - Prescription Issue >> Jul 13, 2024 11:49 AM Tiffini S wrote: Reason for CRM: Communication: Patient is calling to check the status of Wegovy - called on 07/04/24 with the request but no updates. Called [facility], spoke with Vonda.   Please contact the patient with any updates at [phone].

## 2024-07-15 NOTE — Telephone Encounter (Signed)
 Can we please reach out to patient and schedule for a provider visit or nurse if unable to schedule patient in next 2 weeks with PCP. Thank you!

## 2024-07-15 NOTE — Telephone Encounter (Signed)
 Scheduled for Nurse visit

## 2024-07-18 ENCOUNTER — Other Ambulatory Visit (HOSPITAL_COMMUNITY): Payer: Self-pay

## 2024-07-18 ENCOUNTER — Ambulatory Visit

## 2024-07-18 ENCOUNTER — Telehealth: Payer: Self-pay | Admitting: Pharmacy Technician

## 2024-07-18 NOTE — Telephone Encounter (Signed)
 Patient completed weight update during nurse visit today. Notes have been submitted and PA is in progress.

## 2024-07-18 NOTE — Progress Notes (Signed)
 Patient in office today for nurse visit for weight check. Weight has been recorded.   Clinical coverage for weight loss GLP's   Medication being dispensed is Wegovy .   [x]  Product being prescribed is FDA approved for the indication, age, weight (if applicable) and not does not exceed dosing limits per the Prescribing Information per the clinical conditions for use.  [x]  Patient's baseline weight measured within the last 45 days as required by provider before dispensing.  [x]  Patient is attempting a repeat weight loss course of therapy and One of the following:   []  The beneficiary is 33 years of age or over and has ONE of the following:  [x]  A BMI greater than or equal to 30 kg/m2   If patient has one weight-related comorbidity/risk factor/complication (i.e. hypertension, type 2 diabetes, obstructive sleep apnea, cardiovascular disease, dyslipidemia), please list. Patient suffers from weight-related comorbidity/risk factor/complication -Hypertension  [x]  The beneficiary has participated in 6 months or greater of lifestyle modification and will continue while on medication including structured nutrition following provider recommend dietary modifications and physical activity, unless physical activity is not clinically appropriate at the time GLP1 therapy commences AND  [x]  The beneficiary will NOT be using the requested agent in combination with another GLP-1 receptor agonist agent AND  [x]  The beneficiary does NOT have any FDA-labeled contraindications to the requested agent, including pregnancy, lactation, history of medullary thyroid  cancer or multiple endocrine neoplasia type II.   Last BMI/Weight/Height recorded Estimated body mass index is 35.58 kg/m as calculated from the following:   Height as of this encounter: 6' 0.01 (1.829 m).   Weight as of this encounter: 262 lb 6.4 oz (119 kg).

## 2024-07-18 NOTE — Telephone Encounter (Signed)
 Additional information has been requested from the patient's insurance in order to proceed with the prior authorization request. Requested information has been sent, or form has been filled out and faxed back to [employer] @  [phone]   For Wegovy . EOC ID: [id number]

## 2024-07-20 ENCOUNTER — Other Ambulatory Visit (HOSPITAL_COMMUNITY): Payer: Self-pay

## 2024-07-20 ENCOUNTER — Other Ambulatory Visit: Payer: Self-pay

## 2024-07-20 NOTE — Telephone Encounter (Signed)
 Pharmacy Patient Advocate Encounter  Received notification from [employer] that Prior Authorization for Wegovy  0.25mg /0.2ml has been APPROVED from 07/20/2024 to 02/17/2025. Ran test claim, Copay is $4.00. This test claim was processed through [facility]- copay amounts may vary at other pharmacies due to pharmacy/plan contracts, or as the patient moves through the different stages of their insurance plan.   PA #/Case ID/Reference #: [id number]

## 2024-07-20 NOTE — Telephone Encounter (Signed)
Called and notified patient of approval

## 2024-07-21 ENCOUNTER — Other Ambulatory Visit: Payer: Self-pay

## 2024-08-09 ENCOUNTER — Other Ambulatory Visit: Payer: Self-pay

## 2024-08-31 ENCOUNTER — Other Ambulatory Visit: Payer: Self-pay

## 2024-08-31 ENCOUNTER — Other Ambulatory Visit: Payer: Self-pay | Admitting: Family Medicine

## 2024-09-26 ENCOUNTER — Ambulatory Visit: Admitting: Family Medicine

## 2024-09-26 ENCOUNTER — Other Ambulatory Visit: Payer: Self-pay

## 2024-09-26 ENCOUNTER — Encounter: Payer: Self-pay | Admitting: Family Medicine

## 2024-09-26 VITALS — BP 126/73 | HR 76 | Temp 98.2°F | Ht 72.0 in | Wt 245.2 lb

## 2024-09-26 DIAGNOSIS — Z711 Person with feared health complaint in whom no diagnosis is made: Secondary | ICD-10-CM

## 2024-09-26 DIAGNOSIS — E66811 Obesity, class 1: Secondary | ICD-10-CM

## 2024-09-26 DIAGNOSIS — Z20828 Contact with and (suspected) exposure to other viral communicable diseases: Secondary | ICD-10-CM | POA: Diagnosis not present

## 2024-09-26 DIAGNOSIS — R7989 Other specified abnormal findings of blood chemistry: Secondary | ICD-10-CM | POA: Diagnosis not present

## 2024-09-26 DIAGNOSIS — I10 Essential (primary) hypertension: Secondary | ICD-10-CM

## 2024-09-26 LAB — VERITOR FLU A/B WAIVED
Influenza A: NEGATIVE
Influenza B: NEGATIVE

## 2024-09-26 MED ORDER — OSELTAMIVIR PHOSPHATE 75 MG PO CAPS
75.0000 mg | ORAL_CAPSULE | Freq: Every day | ORAL | 0 refills | Status: AC
  Filled 2024-09-26 – 2024-09-28 (×2): qty 10, 10d supply, fill #0

## 2024-09-26 MED ORDER — WEGOVY 1 MG/0.5ML ~~LOC~~ SOAJ
1.0000 mg | SUBCUTANEOUS | 3 refills | Status: AC
  Filled 2024-09-26: qty 2, 28d supply, fill #0
  Filled 2024-11-04: qty 2, 28d supply, fill #1

## 2024-09-26 MED ORDER — LISINOPRIL-HYDROCHLOROTHIAZIDE 20-25 MG PO TABS
1.0000 | ORAL_TABLET | Freq: Every day | ORAL | 1 refills | Status: AC
  Filled 2024-09-26: qty 90, 90d supply, fill #0

## 2024-09-26 NOTE — Assessment & Plan Note (Signed)
 Down 17lbs for 6.5% of his body weight! Tolerating wegovy  well. Will increase to 1mg  and recheck in 3 months. Call with any concerns.

## 2024-09-26 NOTE — Progress Notes (Signed)
 "  BP 126/73   Pulse 76   Temp 98.2 F (36.8 C) (Oral)   Ht 6' (1.829 m)   Wt 245 lb 3.2 oz (111.2 kg)   SpO2 97%   BMI 33.26 kg/m    Subjective:    Patient ID: Daniel Martinez, male    DOB: 10-30-1990, 33 y.o.   MRN: [id number]  HPI: Daniel Martinez is a 33 y.o. male  Chief Complaint  Patient presents with   Hypertension   Flu Exposure    Patient states he has been around people who have had the flu. States he would like to be tested, no current symptoms per patient.    HYPERTENSION  Hypertension status: controlled  Satisfied with current treatment? yes Duration of hypertension: chronic BP monitoring frequency:  not checking BP medication side effects:  no Medication compliance: excellent compliance Previous BP meds: lisinopril -hctz Aspirin: no Recurrent headaches: no Visual changes: no Palpitations: no Dyspnea: no Chest pain: no Lower extremity edema: no Dizzy/lightheaded: no  OBESITY Duration: chronic Previous attempts at weight loss: yes- portion control, exercise, weight watchers Complications of obesity: HTN, GERD Peak weight: 262 (current) Weight loss goal: 200# Weight loss to date: 17lbs Requesting obesity pharmacotherapy: yes Current weight loss supplements/medications: no Previous weight loss supplements/meds: no  Both his Mom and his Dad who he lives with have tested positive for the flu. He doesn't have symptoms right now but is concerned about getting it. No other concerns or complaints at this time.   Relevant past medical, surgical, family and social history reviewed and updated as indicated. Interim medical history since our last visit reviewed. Allergies and medications reviewed and updated.  Review of Systems  Constitutional: Negative.   Respiratory: Negative.    Cardiovascular: Negative.   Musculoskeletal: Negative.   Neurological: Negative.   Psychiatric/Behavioral: Negative.      Per HPI unless specifically indicated above      Objective:    BP 126/73   Pulse 76   Temp 98.2 F (36.8 C) (Oral)   Ht 6' (1.829 m)   Wt 245 lb 3.2 oz (111.2 kg)   SpO2 97%   BMI 33.26 kg/m   Wt Readings from Last 3 Encounters:  09/26/24 245 lb 3.2 oz (111.2 kg)  07/18/24 262 lb 6.4 oz (119 kg)  04/28/24 262 lb 6.4 oz (119 kg)    Physical Exam Vitals and nursing note reviewed.  Constitutional:      General: He is not in acute distress.    Appearance: Normal appearance. He is obese. He is not ill-appearing, toxic-appearing or diaphoretic.  HENT:     Head: Normocephalic and atraumatic.     Right Ear: External ear normal.     Left Ear: External ear normal.     Nose: Nose normal.     Mouth/Throat:     Mouth: Mucous membranes are moist.     Pharynx: Oropharynx is clear.  Eyes:     General: No scleral icterus.       Right eye: No discharge.        Left eye: No discharge.     Extraocular Movements: Extraocular movements intact.     Conjunctiva/sclera: Conjunctivae normal.     Pupils: Pupils are equal, round, and reactive to light.  Cardiovascular:     Rate and Rhythm: Normal rate and regular rhythm.     Pulses: Normal pulses.     Heart sounds: Normal heart sounds. No murmur heard.    No friction  rub. No gallop.  Pulmonary:     Effort: Pulmonary effort is normal. No respiratory distress.     Breath sounds: Normal breath sounds. No stridor. No wheezing, rhonchi or rales.  Chest:     Chest wall: No tenderness.  Musculoskeletal:        General: Normal range of motion.     Cervical back: Normal range of motion and neck supple.  Skin:    General: Skin is warm and dry.     Capillary Refill: Capillary refill takes less than 2 seconds.     Coloration: Skin is not jaundiced or pale.     Findings: No bruising, erythema, lesion or rash.  Neurological:     General: No focal deficit present.     Mental Status: He is alert and oriented to person, place, and time. Mental status is at baseline.  Psychiatric:        Mood and  Affect: Mood normal.        Behavior: Behavior normal.        Thought Content: Thought content normal.        Judgment: Judgment normal.     Results for orders placed or performed in visit on 04/28/24  Microalbumin, Urine Waived   Collection Time: 04/28/24  8:33 AM  Result Value Ref Range   Microalb, Ur Waived 80 (H) 0 - 19 mg/L   Creatinine, Urine Waived 200 10 - 300 mg/dL   Microalb/Creat Ratio 30-300 (H) <30 mg/g  Bayer DCA Hb A1c Waived   Collection Time: 04/28/24  8:33 AM  Result Value Ref Range   HB A1C (BAYER DCA - WAIVED) 5.4 4.8 - 5.6 %  Comprehensive metabolic panel with GFR   Collection Time: 04/28/24  8:39 AM  Result Value Ref Range   Glucose 97 70 - 99 mg/dL   BUN 11 6 - 20 mg/dL   Creatinine, Ser 9.08 0.76 - 1.27 mg/dL   eGFR 884 >40 fO/fpw/8.26   BUN/Creatinine Ratio 12 9 - 20   Sodium 138 134 - 144 mmol/L   Potassium 3.9 3.5 - 5.2 mmol/L   Chloride 97 96 - 106 mmol/L   CO2 25 20 - 29 mmol/L   Calcium 9.5 8.7 - 10.2 mg/dL   Total Protein 7.3 6.0 - 8.5 g/dL   Albumin 4.5 4.1 - 5.1 g/dL   Globulin, Total 2.8 1.5 - 4.5 g/dL   Bilirubin Total 0.5 0.0 - 1.2 mg/dL   Alkaline Phosphatase 82 44 - 121 IU/L   AST 22 0 - 40 IU/L   ALT 33 0 - 44 IU/L  CBC with Differential/Platelet   Collection Time: 04/28/24  8:39 AM  Result Value Ref Range   WBC 6.5 3.4 - 10.8 x10E3/uL   RBC 5.20 4.14 - 5.80 x10E6/uL   Hemoglobin 15.3 13.0 - 17.7 g/dL   Hematocrit 54.6 62.4 - 51.0 %   MCV 87 79 - 97 fL   MCH 29.4 26.6 - 33.0 pg   MCHC 33.8 31.5 - 35.7 g/dL   RDW 86.7 88.3 - 84.5 %   Platelets 328 150 - 450 x10E3/uL   Neutrophils 52 Not Estab. %   Lymphs 29 Not Estab. %   Monocytes 10 Not Estab. %   Eos 7 Not Estab. %   Basos 1 Not Estab. %   Neutrophils Absolute 3.5 1.4 - 7.0 x10E3/uL   Lymphocytes Absolute 1.9 0.7 - 3.1 x10E3/uL   Monocytes Absolute 0.6 0.1 - 0.9 x10E3/uL   EOS (ABSOLUTE)  0.4 0.0 - 0.4 x10E3/uL   Basophils Absolute 0.1 0.0 - 0.2 x10E3/uL   Immature  Granulocytes 0 Not Estab. %   Immature Grans (Abs) 0.0 0.0 - 0.1 x10E3/uL  Lipid Panel w/o Chol/HDL Ratio   Collection Time: 04/28/24  8:39 AM  Result Value Ref Range   Cholesterol, Total 171 100 - 199 mg/dL   Triglycerides 846 (H) 0 - 149 mg/dL   HDL 50 >60 mg/dL   VLDL Cholesterol Cal 27 5 - 40 mg/dL   LDL Chol Calc (NIH) 94 0 - 99 mg/dL  TSH   Collection Time: 04/28/24  8:39 AM  Result Value Ref Range   TSH 0.884 0.450 - 4.500 uIU/mL  Prolactin   Collection Time: 04/28/24  8:39 AM  Result Value Ref Range   Prolactin 24.4 (H) 3.9 - 22.7 ng/mL  VITAMIN D  25 Hydroxy (Vit-D Deficiency, Fractures)   Collection Time: 04/28/24  8:39 AM  Result Value Ref Range   Vit D, 25-Hydroxy 31.0 30.0 - 100.0 ng/mL      Assessment & Plan:   Problem List Items Addressed This Visit       Cardiovascular and Mediastinum   Essential hypertension - Primary   Under good control on current regimen. Continue current regimen. Continue to monitor. Call with any concerns. Refills given. Labs drawn today.        Relevant Medications   lisinopril -hydrochlorothiazide  (ZESTORETIC ) 20-25 MG tablet   Other Relevant Orders   Basic metabolic panel with GFR     Other   Elevated prolactin level   Rechecking labs today. Await results. Treat as needed.       Relevant Orders   Prolactin   Obesity (BMI 30.0-34.9)   Down 17lbs for 6.5% of his body weight! Tolerating wegovy  well. Will increase to 1mg  and recheck in 3 months. Call with any concerns.       Other Visit Diagnoses       Concern about skin disease without diagnosis       Referral to dermatology placed today.   Relevant Orders   Ambulatory referral to Dermatology     Exposure to the flu       Negative. Will treat with tamiflu  for household exposures. Call with any concerns.   Relevant Orders   Influenza A & B (STAT)        Follow up plan: Return in about 3 months (around 12/25/2024) for weight check and shave biopsy.      "

## 2024-09-26 NOTE — Assessment & Plan Note (Signed)
 Rechecking labs today. Await results. Treat as needed.

## 2024-09-26 NOTE — Assessment & Plan Note (Signed)
 Under good control on current regimen. Continue current regimen. Continue to monitor. Call with any concerns. Refills given. Labs drawn today.

## 2024-09-27 ENCOUNTER — Ambulatory Visit: Payer: Self-pay | Admitting: Family Medicine

## 2024-09-27 ENCOUNTER — Other Ambulatory Visit: Payer: Self-pay

## 2024-09-27 LAB — BASIC METABOLIC PANEL WITH GFR
BUN/Creatinine Ratio: 17 (ref 9–20)
BUN: 15 mg/dL (ref 6–20)
CO2: 26 mmol/L (ref 20–29)
Calcium: 10 mg/dL (ref 8.7–10.2)
Chloride: 95 mmol/L — ABNORMAL LOW (ref 96–106)
Creatinine, Ser: 0.9 mg/dL (ref 0.76–1.27)
Glucose: 94 mg/dL (ref 70–99)
Potassium: 4.7 mmol/L (ref 3.5–5.2)
Sodium: 134 mmol/L (ref 134–144)
eGFR: 116 mL/min/1.73

## 2024-09-27 LAB — PROLACTIN: Prolactin: 14.7 ng/mL (ref 3.9–22.7)

## 2024-09-28 ENCOUNTER — Other Ambulatory Visit: Payer: Self-pay

## 2024-10-10 ENCOUNTER — Encounter: Payer: Self-pay | Admitting: Dermatology

## 2024-10-10 ENCOUNTER — Ambulatory Visit (INDEPENDENT_AMBULATORY_CARE_PROVIDER_SITE_OTHER): Admitting: Dermatology

## 2024-10-10 ENCOUNTER — Other Ambulatory Visit: Payer: Self-pay

## 2024-10-10 DIAGNOSIS — D2371 Other benign neoplasm of skin of right lower limb, including hip: Secondary | ICD-10-CM

## 2024-10-10 DIAGNOSIS — L280 Lichen simplex chronicus: Secondary | ICD-10-CM | POA: Diagnosis not present

## 2024-10-10 DIAGNOSIS — L300 Nummular dermatitis: Secondary | ICD-10-CM

## 2024-10-10 DIAGNOSIS — D2372 Other benign neoplasm of skin of left lower limb, including hip: Secondary | ICD-10-CM | POA: Diagnosis not present

## 2024-10-10 DIAGNOSIS — D239 Other benign neoplasm of skin, unspecified: Secondary | ICD-10-CM

## 2024-10-10 MED ORDER — CLOBETASOL PROPIONATE 0.05 % EX CREA
1.0000 | TOPICAL_CREAM | Freq: Two times a day (BID) | CUTANEOUS | 5 refills | Status: AC
  Filled 2024-10-10: qty 30, 30d supply, fill #0

## 2024-10-10 NOTE — Progress Notes (Signed)
" ° °  New Patient Visit   Subjective  Daniel Martinez is a 34 y.o. male who presents for the following: patient concerned he could have psoriasis at lower legs, hands, arms near elbows. No fhx psoriasis, no hx eczema. Patient prescribed TMC 0.5% ointment by PCP.   Spot at left thigh that gets irritated. No fhx skin cancer.   The following portions of the chart were reviewed this encounter and updated as appropriate: medications, allergies, medical history  Review of Systems:  No other skin or systemic complaints except as noted in HPI or Assessment and Plan.  Objective  Well appearing patient in no apparent distress; mood and affect are within normal limits.   A focused examination was performed of the following areas: Legs, arms  Relevant exam findings are noted in the Assessment and Plan.    Assessment & Plan    ECZEMA with LSC Exam: pink scaly papules coalescing to circular plaques on lower legs. Lichenified scaly plaques of knees and elbows. Scaly plaques of palms  Atopic dermatitis (eczema) is a chronic, relapsing, pruritic condition that can significantly affect quality of life. It is often associated with allergic rhinitis and/or asthma and can require treatment with topical medications, phototherapy, or in severe cases biologic injectable medication (Dupixent, Adbry, Ebglyss) or oral JAK inhibitors (Rinvoq, Cibinqo).    Treatment Plan: Apply clobetasol  0.05% cr twice daily until smooth then prn flares. Avoid applying to face, groin, and axilla. Use as directed. Long-term use can cause thinning of the skin. Recommend moisturizer multiple times daily to help with dry skin.  DERMATOFIBROMA Exam: Firm pink/brown papulenodule with dimple sign at left upper thigh, right lower leg.  Treatment Plan: A dermatofibroma is a benign growth possibly related to trauma, such as an insect bite, cut from shaving, or inflamed acne-type bump.  Treatment options to remove include shave or  excision with resulting scar and risk of recurrence.  Since benign-appearing and not bothersome, will observe for now.   NUMMULAR DERMATITIS   LSC (LICHEN SIMPLEX CHRONICUS)   DERMATOFIBROMA    Return if symptoms worsen or fail to improve.  Daniel Martinez [name], RMA, am acting as scribe for Boneta Sharps, MD .   Documentation: I have reviewed the above documentation for accuracy and completeness, and I agree with the above.  Boneta Sharps, MD    "

## 2024-10-10 NOTE — Patient Instructions (Signed)

## 2024-10-31 ENCOUNTER — Ambulatory Visit: Admitting: Family Medicine

## 2024-11-05 ENCOUNTER — Other Ambulatory Visit: Payer: Self-pay

## 2024-12-28 ENCOUNTER — Ambulatory Visit: Admitting: Family Medicine
